# Patient Record
Sex: Female | Born: 2000 | Race: Black or African American | Hispanic: No | Marital: Single | Smoking: Never smoker
Health system: Southern US, Community
[De-identification: ages and names within clinical notes are randomized; demographics above are authoritative.]

## PROBLEM LIST (undated history)

## (undated) DIAGNOSIS — Z789 Other specified health status: Secondary | ICD-10-CM

## (undated) DIAGNOSIS — D649 Anemia, unspecified: Secondary | ICD-10-CM

## (undated) DIAGNOSIS — O139 Gestational [pregnancy-induced] hypertension without significant proteinuria, unspecified trimester: Secondary | ICD-10-CM

## (undated) HISTORY — PX: WISDOM TOOTH EXTRACTION: SHX21

## (undated) HISTORY — PX: NO PAST SURGERIES: SHX2092

## (undated) HISTORY — DX: Anemia, unspecified: D64.9

---
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
## 2021-03-13 ENCOUNTER — Inpatient Hospital Stay (HOSPITAL_COMMUNITY)
Admission: AD | Admit: 2021-03-13 | Discharge: 2021-03-13 | Disposition: A | Discharge status: Home / Self Care | Payer: Medicaid Other | Attending: Obstetrics and Gynecology | Admitting: Obstetrics and Gynecology

## 2021-03-13 ENCOUNTER — Other Ambulatory Visit: Payer: Self-pay

## 2021-03-13 ENCOUNTER — Encounter (HOSPITAL_COMMUNITY): Payer: Self-pay | Admitting: Obstetrics and Gynecology

## 2021-03-13 DIAGNOSIS — O99891 Other specified diseases and conditions complicating pregnancy: Secondary | ICD-10-CM | POA: Diagnosis not present

## 2021-03-13 DIAGNOSIS — O99350 Diseases of the nervous system complicating pregnancy, unspecified trimester: Secondary | ICD-10-CM | POA: Diagnosis not present

## 2021-03-13 DIAGNOSIS — G43009 Migraine without aura, not intractable, without status migrainosus: Secondary | ICD-10-CM

## 2021-03-13 DIAGNOSIS — G43909 Migraine, unspecified, not intractable, without status migrainosus: Secondary | ICD-10-CM | POA: Diagnosis not present

## 2021-03-13 DIAGNOSIS — R110 Nausea: Secondary | ICD-10-CM | POA: Insufficient documentation

## 2021-03-13 DIAGNOSIS — O219 Vomiting of pregnancy, unspecified: Secondary | ICD-10-CM

## 2021-03-13 DIAGNOSIS — Z3A00 Weeks of gestation of pregnancy not specified: Secondary | ICD-10-CM

## 2021-03-13 MED ORDER — PROMETHAZINE HCL 25 MG PO TABS: 25.0000 mg | ORAL_TABLET | Freq: Four times a day (QID) | ORAL | 0 refills | Status: DC | PRN

## 2021-03-13 MED ORDER — ACETAMINOPHEN 325 MG PO TABS: 650.0000 mg | ORAL_TABLET | ORAL | 0 refills | Status: DC | PRN

## 2021-03-13 MED ORDER — ACETAMINOPHEN 325 MG PO TABS: 650.0000 mg | ORAL_TABLET | ORAL | 0 refills | Status: AC | PRN

## 2021-03-13 NOTE — MAU Note (Addendum)
Having more nausea than usual (no vomiting) and getting headaches off and on. Hx migraines. Usually take tylenol for headaches but have not taken anything as I do not know what is safe in pregnancy. LMP 01/30/21 Denies VB or d/c and no pregnancy concerns

## 2021-03-13 NOTE — Discharge Instructions (Signed)

## 2021-03-13 NOTE — Progress Notes (Signed)
Thalia Bloodgood CNM in earlier to discuss d/c plan with pt. Written and verbal d/c instructions given and understanding voiced

## 2021-03-13 NOTE — MAU Provider Note (Signed)
Event Date/Time  First Provider Initiated Contact with Patient 03/13/21 0329     S Ms. Jessica Christian is a 20 y.o. G2P0 patient who presents to MAU today with chief complaints of nausea and migraine. These are recurrent problems which the patient typically manages with Tylenol. She reports a migraine pain score of 3/10 on arrival to MAU. She denies aggravating or alleviating factors. She has not taken medication or tried other treatments for this complaint. She declines medication in MAU.  O BP 122/72 (BP Location: Right Arm)   Pulse 92   Temp 98 F (36.7 C)   Resp 17   Ht 5\' 4"  (1.626 m)   Wt 109.8 kg   LMP 01/30/2021   SpO2 100%   BMI 41.54 kg/m    Physical Exam Vitals and nursing note reviewed.  Constitutional:      Appearance: She is well-developed.  Cardiovascular:     Rate and Rhythm: Normal rate and regular rhythm.     Heart sounds: Normal heart sounds.  Pulmonary:     Effort: Pulmonary effort is normal.     Breath sounds: Normal breath sounds.  Skin:    Capillary Refill: Capillary refill takes less than 2 seconds.  Neurological:     Mental Status: She is alert and oriented to person, place, and time.  Psychiatric:        Mood and Affect: Mood normal.        Speech: Speech normal.        Behavior: Behavior normal.    A Medical screening exam complete No acute concerns, declines treatment in MAU Nausea without vomiting Rx to pharmacy  Meds ordered this encounter  Medications   acetaminophen (TYLENOL) 325 MG tablet    Sig: Take 2 tablets (650 mg total) by mouth every 4 (four) hours as needed for moderate pain.    Dispense:  240 tablet    Refill:  0    Order Specific Question:   Supervising Provider    Answer:   02/01/2021 Myna Hidalgo   promethazine (PHENERGAN) 25 MG tablet    Sig: Take 1 tablet (25 mg total) by mouth every 6 (six) hours as needed for nausea or vomiting.    Dispense:  30 tablet    Refill:  0    Order Specific Question:   Supervising  Provider    Answer:   [[id number]] Myna Hidalgo    P Discharge from MAU in stable condition Patient may return to MAU as needed   [[id number]], CNM 03/13/2021 7:21 AM

## 2021-03-23 ENCOUNTER — Inpatient Hospital Stay (HOSPITAL_COMMUNITY)
Admission: AD | Admit: 2021-03-23 | Discharge: 2021-03-24 | Disposition: A | Discharge status: Home / Self Care | Payer: Medicaid Other | Attending: Obstetrics & Gynecology | Admitting: Obstetrics & Gynecology

## 2021-03-23 ENCOUNTER — Other Ambulatory Visit: Payer: Self-pay

## 2021-03-23 DIAGNOSIS — J069 Acute upper respiratory infection, unspecified: Secondary | ICD-10-CM

## 2021-03-23 DIAGNOSIS — O99511 Diseases of the respiratory system complicating pregnancy, first trimester: Secondary | ICD-10-CM | POA: Insufficient documentation

## 2021-03-23 DIAGNOSIS — Z20822 Contact with and (suspected) exposure to covid-19: Secondary | ICD-10-CM | POA: Insufficient documentation

## 2021-03-23 DIAGNOSIS — Z3A01 Less than 8 weeks gestation of pregnancy: Secondary | ICD-10-CM | POA: Insufficient documentation

## 2021-03-23 NOTE — MAU Note (Signed)
..  Jessica Christian is a 20 y.o. at [redacted]w[redacted]d here in MAU reporting: she has not been feeling well. Reports sore throat, nasal congestion, and sharp upper abdominal pain that comes and goes. Has not taken a COVID test. Reports that there isn't anyone around her that is sick.   Denies vaginal bleeding or leaking of fluid.   Pain score: 3/10 Vitals:   03/23/21 2358  BP: 127/84  Pulse: 92  Resp: 20  Temp: 98.9 F (37.2 C)  SpO2: 100%

## 2021-03-24 ENCOUNTER — Encounter (HOSPITAL_COMMUNITY): Payer: Self-pay | Admitting: Obstetrics & Gynecology

## 2021-03-24 DIAGNOSIS — Z3A01 Less than 8 weeks gestation of pregnancy: Secondary | ICD-10-CM

## 2021-03-24 DIAGNOSIS — J029 Acute pharyngitis, unspecified: Secondary | ICD-10-CM | POA: Diagnosis present

## 2021-03-24 DIAGNOSIS — J069 Acute upper respiratory infection, unspecified: Secondary | ICD-10-CM

## 2021-03-24 DIAGNOSIS — O99511 Diseases of the respiratory system complicating pregnancy, first trimester: Secondary | ICD-10-CM

## 2021-03-24 DIAGNOSIS — Z20822 Contact with and (suspected) exposure to covid-19: Secondary | ICD-10-CM | POA: Diagnosis not present

## 2021-03-24 LAB — RESP PANEL BY RT-PCR (FLU A&B, COVID) ARPGX2
Influenza A by PCR: NEGATIVE
Influenza B by PCR: NEGATIVE
SARS Coronavirus 2 by RT PCR: NEGATIVE

## 2021-03-24 LAB — GROUP A STREP BY PCR: Group A Strep by PCR: NOT DETECTED

## 2021-03-24 NOTE — MAU Provider Note (Signed)
History     CSN: [id number]  Arrival date and time: 03/23/21 2346   Event Date/Time   First Provider Initiated Contact with Patient 03/24/21 0008       20 y.o. G2P0010 @[redacted]w[redacted]d  presenting with sore throat and nasal congestion. Sx started 3 days ago. Denies cough or SOB. Denies fever. Denies sick contacts or Covid exposure. Has not tried OTC remedies yet.    OB History     Gravida  2   Para      Term      Preterm      AB  1   Living         SAB  1   IAB      Ectopic      Multiple      Live Births              Past Medical History:  Diagnosis Date   Medical history non-contributory     Past Surgical History:  Procedure Laterality Date   NO PAST SURGERIES      Family History  Problem Relation Age of Onset   Healthy Mother    Healthy Father     Social History   Tobacco Use   Smoking status: Never   Smokeless tobacco: Never  Vaping Use   Vaping Use: Never used  Substance Use Topics   Alcohol use: No   Drug use: Never    Allergies: No Known Allergies  No medications prior to admission.    Review of Systems  Constitutional:  Negative for chills and fever.  HENT:  Positive for congestion, rhinorrhea and sore throat. Negative for ear pain.   Respiratory:  Negative for cough and shortness of breath.   Physical Exam   Blood pressure 127/84, pulse 92, temperature 98.9 F (37.2 C), temperature source Oral, resp. rate 20, height 5\' 4"  (1.626 m), weight 110.4 kg, last menstrual period 01/30/2021, SpO2 100 %, unknown if currently breastfeeding.  Physical Exam Vitals and nursing note reviewed.  Constitutional:      General: She is not in acute distress.    Appearance: Normal appearance.  HENT:     Head: Normocephalic and atraumatic.     Mouth/Throat:     Mouth: Mucous membranes are moist.     Pharynx: Oropharynx is clear. Uvula midline. No oropharyngeal exudate or posterior oropharyngeal erythema.     Tonsils: No tonsillar exudate.   Cardiovascular:     Rate and Rhythm: Normal rate and regular rhythm.     Heart sounds: Normal heart sounds.  Pulmonary:     Effort: Pulmonary effort is normal. No respiratory distress.     Breath sounds: Normal breath sounds. No stridor. No wheezing, rhonchi or rales.  Musculoskeletal:        General: Normal range of motion.  Skin:    General: Skin is warm and dry.  Neurological:     General: No focal deficit present.     Mental Status: She is alert and oriented to person, place, and time.  Psychiatric:        Mood and Affect: Mood normal.        Behavior: Behavior normal.   Results for orders placed or performed during the hospital encounter of 03/23/21 (from the past 24 hour(s))  Group A Strep by PCR     Status: None   Collection Time: 03/24/21 12:34 AM   Specimen: Throat; Sterile Swab  Result Value Ref Range   Group A Strep by PCR  NOT DETECTED NOT DETECTED  Resp Panel by RT-PCR (Flu A&B, Covid) Nasopharyngeal Swab     Status: None   Collection Time: 03/24/21 12:34 AM   Specimen: Nasopharyngeal Swab; Nasopharyngeal(NP) swabs in vial transport medium  Result Value Ref Range   SARS Coronavirus 2 by RT PCR NEGATIVE NEGATIVE   Influenza A by PCR NEGATIVE NEGATIVE   Influenza B by PCR NEGATIVE NEGATIVE   MAU Course  Procedures  MDM Labs ordered. Likely viral, common cold. Recommended supportive therapy. Stable for discharge home.   Assessment and Plan   1. [redacted] weeks gestation of pregnancy   2. Upper respiratory virus    Discharge home Follow up with OB provider to start care Safe med list provided Return precautions  Allergies as of 03/24/2021   No Known Allergies      Medication List     TAKE these medications    acetaminophen 325 MG tablet Commonly known as: Tylenol Take 2 tablets (650 mg total) by mouth every 4 (four) hours as needed for moderate pain.   multivitamin-prenatal 27-0.8 MG Tabs tablet Take 1 tablet by mouth daily at 12 noon.   promethazine  25 MG tablet Commonly known as: PHENERGAN Take 1 tablet (25 mg total) by mouth every 6 (six) hours as needed for nausea or vomiting.       Donette Larry, CNM 03/24/2021, 2:13 AM

## 2021-03-24 NOTE — Discharge Instructions (Signed)

## 2021-04-22 LAB — OB RESULTS CONSOLE ABO/RH: RH Type: POSITIVE

## 2021-04-22 LAB — OB RESULTS CONSOLE RUBELLA ANTIBODY, IGM: Rubella: IMMUNE

## 2021-04-22 LAB — HEPATITIS C ANTIBODY: HCV Ab: NEGATIVE

## 2021-04-22 LAB — OB RESULTS CONSOLE HIV ANTIBODY (ROUTINE TESTING): HIV: NONREACTIVE

## 2021-04-22 LAB — OB RESULTS CONSOLE HEPATITIS B SURFACE ANTIGEN: Hepatitis B Surface Ag: NEGATIVE

## 2021-05-19 ENCOUNTER — Other Ambulatory Visit: Payer: Self-pay | Admitting: Obstetrics and Gynecology

## 2021-05-19 DIAGNOSIS — Z363 Encounter for antenatal screening for malformations: Secondary | ICD-10-CM

## 2021-05-27 ENCOUNTER — Other Ambulatory Visit: Payer: Self-pay

## 2021-06-07 NOTE — L&D Delivery Note (Signed)
Delivery Note She progressed to complete and pushed well.  At 7:10 PM a viable female was delivered via Vaginal, Spontaneous (Presentation: Right Occiput Anterior).  APGAR: 8, 9; weight pending.   Placenta status: Spontaneous, Intact.  Cord: 3 vessels with the following complications: None.  Anesthesia: Epidural Episiotomy: None Lacerations: 2nd degree;Perineal Suture Repair: 3.0 vicryl rapide Est. Blood Loss (mL): 100  Mom to postpartum.  Baby to Couplet care / Skin to Skin.  Leighton Roach Maxxwell Edgett 10/27/2021, 7:29 PM

## 2021-06-12 ENCOUNTER — Other Ambulatory Visit: Payer: Medicaid Other

## 2021-06-12 ENCOUNTER — Ambulatory Visit: Payer: Medicaid Other

## 2021-06-15 ENCOUNTER — Ambulatory Visit (HOSPITAL_BASED_OUTPATIENT_CLINIC_OR_DEPARTMENT_OTHER): Payer: Medicaid Other

## 2021-06-15 ENCOUNTER — Ambulatory Visit: Payer: Medicaid Other | Attending: Obstetrics and Gynecology | Admitting: *Deleted

## 2021-06-15 ENCOUNTER — Encounter: Payer: Self-pay | Admitting: *Deleted

## 2021-06-15 ENCOUNTER — Other Ambulatory Visit: Payer: Self-pay

## 2021-06-15 VITALS — BP 127/74 | HR 88

## 2021-06-15 DIAGNOSIS — O99212 Obesity complicating pregnancy, second trimester: Secondary | ICD-10-CM | POA: Diagnosis not present

## 2021-06-15 DIAGNOSIS — Z363 Encounter for antenatal screening for malformations: Secondary | ICD-10-CM | POA: Insufficient documentation

## 2021-06-15 DIAGNOSIS — Z3A19 19 weeks gestation of pregnancy: Secondary | ICD-10-CM | POA: Insufficient documentation

## 2021-06-15 DIAGNOSIS — O09892 Supervision of other high risk pregnancies, second trimester: Secondary | ICD-10-CM

## 2021-06-16 ENCOUNTER — Other Ambulatory Visit: Payer: Self-pay | Admitting: *Deleted

## 2021-06-16 DIAGNOSIS — O99212 Obesity complicating pregnancy, second trimester: Secondary | ICD-10-CM

## 2021-06-16 DIAGNOSIS — Z362 Encounter for other antenatal screening follow-up: Secondary | ICD-10-CM

## 2021-06-20 ENCOUNTER — Other Ambulatory Visit: Payer: Self-pay

## 2021-06-20 ENCOUNTER — Encounter (HOSPITAL_COMMUNITY): Payer: Self-pay | Admitting: Obstetrics and Gynecology

## 2021-06-20 ENCOUNTER — Inpatient Hospital Stay (HOSPITAL_COMMUNITY)
Admission: AD | Admit: 2021-06-20 | Discharge: 2021-06-20 | Disposition: A | Discharge status: Home / Self Care | Payer: Medicaid Other | Attending: Obstetrics and Gynecology | Admitting: Obstetrics and Gynecology

## 2021-06-20 DIAGNOSIS — Z3A20 20 weeks gestation of pregnancy: Secondary | ICD-10-CM | POA: Diagnosis not present

## 2021-06-20 DIAGNOSIS — O9A212 Injury, poisoning and certain other consequences of external causes complicating pregnancy, second trimester: Secondary | ICD-10-CM | POA: Insufficient documentation

## 2021-06-20 DIAGNOSIS — Y92410 Unspecified street and highway as the place of occurrence of the external cause: Secondary | ICD-10-CM | POA: Diagnosis not present

## 2021-06-20 DIAGNOSIS — O368120 Decreased fetal movements, second trimester, not applicable or unspecified: Secondary | ICD-10-CM | POA: Diagnosis not present

## 2021-06-20 DIAGNOSIS — T1490XA Injury, unspecified, initial encounter: Secondary | ICD-10-CM | POA: Insufficient documentation

## 2021-06-20 DIAGNOSIS — Z041 Encounter for examination and observation following transport accident: Secondary | ICD-10-CM | POA: Insufficient documentation

## 2021-06-20 LAB — URINALYSIS, ROUTINE W REFLEX MICROSCOPIC
Bilirubin Urine: NEGATIVE
Glucose, UA: NEGATIVE mg/dL
Hgb urine dipstick: NEGATIVE
Ketones, ur: 40 mg/dL — AB
Leukocytes,Ua: NEGATIVE
Nitrite: NEGATIVE
Protein, ur: NEGATIVE mg/dL
Specific Gravity, Urine: >1.03 — ABNORMAL HIGH (ref 1.005–1.030)
pH: 6 (ref 5.0–8.0)

## 2021-06-20 NOTE — Discharge Instructions (Signed)
You can take Tylenol 1000 mg every 8 hours as needed for pain.
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
## 2021-07-14 ENCOUNTER — Ambulatory Visit: Payer: Medicaid Other | Admitting: *Deleted

## 2021-07-14 ENCOUNTER — Encounter: Payer: Self-pay | Admitting: *Deleted

## 2021-07-14 ENCOUNTER — Ambulatory Visit: Payer: Medicaid Other | Attending: Obstetrics

## 2021-07-14 ENCOUNTER — Other Ambulatory Visit: Payer: Self-pay | Admitting: *Deleted

## 2021-07-14 ENCOUNTER — Other Ambulatory Visit: Payer: Self-pay

## 2021-07-14 VITALS — BP 130/70 | HR 90

## 2021-07-14 DIAGNOSIS — O283 Abnormal ultrasonic finding on antenatal screening of mother: Secondary | ICD-10-CM

## 2021-07-14 DIAGNOSIS — Z3A23 23 weeks gestation of pregnancy: Secondary | ICD-10-CM

## 2021-07-14 DIAGNOSIS — Z362 Encounter for other antenatal screening follow-up: Secondary | ICD-10-CM | POA: Diagnosis not present

## 2021-07-14 DIAGNOSIS — E668 Other obesity: Secondary | ICD-10-CM | POA: Diagnosis not present

## 2021-07-14 DIAGNOSIS — O99212 Obesity complicating pregnancy, second trimester: Secondary | ICD-10-CM | POA: Diagnosis not present

## 2021-07-14 DIAGNOSIS — Z6841 Body Mass Index (BMI) 40.0 and over, adult: Secondary | ICD-10-CM

## 2021-07-14 NOTE — Progress Notes (Unsigned)
Ud

## 2021-08-06 LAB — OB RESULTS CONSOLE RPR: RPR: NONREACTIVE

## 2021-08-11 ENCOUNTER — Ambulatory Visit: Payer: Medicaid Other | Admitting: *Deleted

## 2021-08-11 ENCOUNTER — Encounter: Payer: Self-pay | Admitting: *Deleted

## 2021-08-11 ENCOUNTER — Other Ambulatory Visit: Payer: Self-pay

## 2021-08-11 ENCOUNTER — Ambulatory Visit: Payer: Medicaid Other | Attending: Obstetrics

## 2021-08-11 VITALS — BP 127/69 | HR 93

## 2021-08-11 DIAGNOSIS — E669 Obesity, unspecified: Secondary | ICD-10-CM

## 2021-08-11 DIAGNOSIS — O283 Abnormal ultrasonic finding on antenatal screening of mother: Secondary | ICD-10-CM

## 2021-08-11 DIAGNOSIS — O99212 Obesity complicating pregnancy, second trimester: Secondary | ICD-10-CM | POA: Insufficient documentation

## 2021-08-11 DIAGNOSIS — O358XX0 Maternal care for other (suspected) fetal abnormality and damage, not applicable or unspecified: Secondary | ICD-10-CM | POA: Diagnosis not present

## 2021-08-11 DIAGNOSIS — Z3A27 27 weeks gestation of pregnancy: Secondary | ICD-10-CM | POA: Diagnosis not present

## 2021-08-12 ENCOUNTER — Other Ambulatory Visit: Payer: Self-pay | Admitting: *Deleted

## 2021-08-12 DIAGNOSIS — R638 Other symptoms and signs concerning food and fluid intake: Secondary | ICD-10-CM

## 2021-08-12 DIAGNOSIS — O283 Abnormal ultrasonic finding on antenatal screening of mother: Secondary | ICD-10-CM

## 2021-09-08 ENCOUNTER — Ambulatory Visit: Payer: Medicaid Other | Admitting: *Deleted

## 2021-09-08 ENCOUNTER — Ambulatory Visit: Payer: Medicaid Other | Attending: Obstetrics

## 2021-09-08 ENCOUNTER — Encounter: Payer: Self-pay | Admitting: *Deleted

## 2021-09-08 VITALS — BP 126/75 | HR 82

## 2021-09-08 DIAGNOSIS — E669 Obesity, unspecified: Secondary | ICD-10-CM

## 2021-09-08 DIAGNOSIS — Z3A31 31 weeks gestation of pregnancy: Secondary | ICD-10-CM | POA: Diagnosis not present

## 2021-09-08 DIAGNOSIS — O283 Abnormal ultrasonic finding on antenatal screening of mother: Secondary | ICD-10-CM | POA: Diagnosis present

## 2021-09-08 DIAGNOSIS — Z6841 Body Mass Index (BMI) 40.0 and over, adult: Secondary | ICD-10-CM

## 2021-09-08 DIAGNOSIS — O99213 Obesity complicating pregnancy, third trimester: Secondary | ICD-10-CM | POA: Diagnosis not present

## 2021-09-08 DIAGNOSIS — O358XX0 Maternal care for other (suspected) fetal abnormality and damage, not applicable or unspecified: Secondary | ICD-10-CM

## 2021-09-08 DIAGNOSIS — R638 Other symptoms and signs concerning food and fluid intake: Secondary | ICD-10-CM | POA: Insufficient documentation

## 2021-09-10 ENCOUNTER — Other Ambulatory Visit: Payer: Self-pay | Admitting: *Deleted

## 2021-09-10 DIAGNOSIS — O99213 Obesity complicating pregnancy, third trimester: Secondary | ICD-10-CM

## 2021-09-10 DIAGNOSIS — O283 Abnormal ultrasonic finding on antenatal screening of mother: Secondary | ICD-10-CM

## 2021-09-16 ENCOUNTER — Ambulatory Visit: Payer: Medicaid Other | Admitting: *Deleted

## 2021-09-16 ENCOUNTER — Encounter: Payer: Self-pay | Admitting: *Deleted

## 2021-09-16 ENCOUNTER — Ambulatory Visit: Payer: Medicaid Other | Attending: Obstetrics | Admitting: *Deleted

## 2021-09-16 VITALS — BP 122/81 | HR 79

## 2021-09-16 DIAGNOSIS — O99213 Obesity complicating pregnancy, third trimester: Secondary | ICD-10-CM | POA: Insufficient documentation

## 2021-09-16 DIAGNOSIS — O283 Abnormal ultrasonic finding on antenatal screening of mother: Secondary | ICD-10-CM

## 2021-09-16 DIAGNOSIS — E669 Obesity, unspecified: Secondary | ICD-10-CM | POA: Diagnosis not present

## 2021-09-16 DIAGNOSIS — Z3A31 31 weeks gestation of pregnancy: Secondary | ICD-10-CM | POA: Insufficient documentation

## 2021-09-16 NOTE — Procedures (Signed)
Jessica Christian ?06/03/2001 ?[redacted]w[redacted]d ? ?Fetus A Non-Stress Test Interpretation for 09/16/21 ? ?Indication:  obese ? ?Fetal Heart Rate A ?Mode: External ?Baseline Rate (A): 150 bpm ?Variability: Moderate ?Accelerations: 15 x 15 ?Decelerations: None ?Multiple birth?: No ? ?Uterine Activity ?Mode: Toco ?Contraction Frequency (min): none ?Resting Tone Palpated: Relaxed ? ?Interpretation (Fetal Testing) ?Nonstress Test Interpretation: Reactive ?Overall Impression: Reassuring for gestational age ?Comments: tracing reviewed by Dr. Grace Bushy ? ? ?

## 2021-09-20 ENCOUNTER — Inpatient Hospital Stay (HOSPITAL_COMMUNITY)
Admission: AD | Admit: 2021-09-20 | Discharge: 2021-09-21 | Disposition: A | Discharge status: Home / Self Care | Payer: Medicaid Other | Attending: Obstetrics and Gynecology | Admitting: Obstetrics and Gynecology

## 2021-09-20 ENCOUNTER — Other Ambulatory Visit: Payer: Self-pay

## 2021-09-20 DIAGNOSIS — O26899 Other specified pregnancy related conditions, unspecified trimester: Secondary | ICD-10-CM

## 2021-09-20 DIAGNOSIS — O26893 Other specified pregnancy related conditions, third trimester: Secondary | ICD-10-CM | POA: Insufficient documentation

## 2021-09-20 DIAGNOSIS — R102 Pelvic and perineal pain: Secondary | ICD-10-CM | POA: Insufficient documentation

## 2021-09-20 DIAGNOSIS — Z3A33 33 weeks gestation of pregnancy: Secondary | ICD-10-CM

## 2021-09-21 DIAGNOSIS — R102 Pelvic and perineal pain: Secondary | ICD-10-CM

## 2021-09-21 DIAGNOSIS — O26893 Other specified pregnancy related conditions, third trimester: Secondary | ICD-10-CM | POA: Diagnosis present

## 2021-09-21 DIAGNOSIS — Z3A33 33 weeks gestation of pregnancy: Secondary | ICD-10-CM

## 2021-09-21 DIAGNOSIS — O26899 Other specified pregnancy related conditions, unspecified trimester: Secondary | ICD-10-CM

## 2021-09-21 LAB — URINALYSIS, ROUTINE W REFLEX MICROSCOPIC
Bilirubin Urine: NEGATIVE
Glucose, UA: NEGATIVE mg/dL
Hgb urine dipstick: NEGATIVE
Ketones, ur: NEGATIVE mg/dL
Nitrite: NEGATIVE
Protein, ur: NEGATIVE mg/dL
Specific Gravity, Urine: 1.018 (ref 1.005–1.030)
pH: 6 (ref 5.0–8.0)

## 2021-09-21 MED ORDER — CYCLOBENZAPRINE HCL 5 MG PO TABS
10.0000 mg | ORAL_TABLET | Freq: Once | ORAL | Status: AC
Start: 2021-09-21 — End: 2021-09-21
  Administered 2021-09-21: 10 mg via ORAL
  Filled 2021-09-21: qty 2

## 2021-09-21 NOTE — MAU Provider Note (Signed)
?History  ?  ? ?CSN: [id number] ? ?Arrival date and time: 09/20/21 2236 ? ? Event Date/Time  ? First Provider Initiated Contact with Patient 09/21/21 0016   ?  ? ?Chief Complaint  ?Patient presents with  ? Back Pain  ? ?HPI ?Jessica Christian is a 21 y.o. G2P0010 at [redacted]w[redacted]d who presents with upper back pain and pelvic pain. She reports she suddenly started having pain in the middle of her upper back 2 days ago. She rates the pain a 6/10 and has not tried anything for it. She also reports intermittent pain in her pelvis that she rates a 5/10 and has not tried anything for. She reports that pain is worse when she moves or changes position. She denies any bleeding or leaking. She reports normal fetal movement.  ? ?OB History   ? ? Gravida  ?2  ? Para  ?   ? Term  ?   ? Preterm  ?   ? AB  ?1  ? Living  ?   ?  ? ? SAB  ?1  ? IAB  ?   ? Ectopic  ?   ? Multiple  ?   ? Live Births  ?   ?   ?  ?  ? ? ?Past Medical History:  ?Diagnosis Date  ? Medical history non-contributory   ? ? ?Past Surgical History:  ?Procedure Laterality Date  ? WISDOM TOOTH EXTRACTION    ? ? ?Family History  ?Problem Relation Age of Onset  ? Healthy Mother   ? Healthy Father   ? Hypertension Maternal Grandmother   ? ? ?Social History  ? ?Tobacco Use  ? Smoking status: Never  ? Smokeless tobacco: Never  ?Vaping Use  ? Vaping Use: Never used  ?Substance Use Topics  ? Alcohol use: No  ? Drug use: Never  ? ? ?Allergies: No Known Allergies ? ?Medications Prior to Admission  ?Medication Sig Dispense Refill Last Dose  ? aspirin EC 81 MG tablet Take 81 mg by mouth daily. Swallow whole.   09/20/2021  ? Prenatal Vit-Fe Fumarate-FA (MULTIVITAMIN-PRENATAL) 27-0.8 MG TABS tablet Take 1 tablet by mouth daily at 12 noon.   09/20/2021  ? ? ?Review of Systems  ?Constitutional: Negative.  Negative for fatigue and fever.  ?HENT: Negative.    ?Respiratory: Negative.  Negative for shortness of breath.   ?Cardiovascular: Negative.  Negative for chest pain.  ?Gastrointestinal:  Negative.  Negative for abdominal pain, constipation, diarrhea, nausea and vomiting.  ?Genitourinary:  Positive for pelvic pain. Negative for dysuria, vaginal bleeding and vaginal discharge.  ?Musculoskeletal:  Positive for back pain.  ?Neurological: Negative.  Negative for dizziness and headaches.  ?Physical Exam  ? ?Blood pressure 123/79, pulse 84, temperature 98.7 ?F (37.1 ?C), temperature source Oral, resp. rate 18, weight 114.3 kg, last menstrual period 01/30/2021, SpO2 99 %, unknown if currently breastfeeding. ? ?Physical Exam ?Vitals and nursing note reviewed.  ?Constitutional:   ?   General: She is not in acute distress. ?   Appearance: She is well-developed.  ?HENT:  ?   Head: Normocephalic.  ?Eyes:  ?   Pupils: Pupils are equal, round, and reactive to light.  ?Cardiovascular:  ?   Rate and Rhythm: Normal rate and regular rhythm.  ?   Heart sounds: Normal heart sounds.  ?Pulmonary:  ?   Effort: Pulmonary effort is normal. No respiratory distress.  ?   Breath sounds: Normal breath sounds.  ?Abdominal:  ?   General: Bowel  sounds are normal. There is no distension.  ?   Palpations: Abdomen is soft.  ?   Tenderness: There is no abdominal tenderness.  ?Skin: ?   General: Skin is warm and dry.  ?Neurological:  ?   Mental Status: She is alert and oriented to person, place, and time.  ?Psychiatric:     ?   Mood and Affect: Mood normal.     ?   Behavior: Behavior normal.     ?   Thought Content: Thought content normal.     ?   Judgment: Judgment normal.  ? ?Fetal Tracing: ? ?Baseline: 140 ?Variability: moderate ?Accels:  15x15 ?Decels: none ? ?Toco: none ? ?Dilation: Closed ?Effacement (%): Thick ?Exam by:: Maryruth Hancock CNM ? ? ?MAU Course  ?Procedures ?Results for orders placed or performed during the hospital encounter of 09/20/21 (from the past 24 hour(s))  ?Urinalysis, Routine w reflex microscopic     Status: Abnormal  ? Collection Time: 09/21/21 12:33 AM  ?Result Value Ref Range  ? Color, Urine YELLOW YELLOW  ?  APPearance CLEAR CLEAR  ? Specific Gravity, Urine 1.018 1.005 - 1.030  ? pH 6.0 5.0 - 8.0  ? Glucose, UA NEGATIVE NEGATIVE mg/dL  ? Hgb urine dipstick NEGATIVE NEGATIVE  ? Bilirubin Urine NEGATIVE NEGATIVE  ? Ketones, ur NEGATIVE NEGATIVE mg/dL  ? Protein, ur NEGATIVE NEGATIVE mg/dL  ? Nitrite NEGATIVE NEGATIVE  ? Leukocytes,Ua TRACE (A) NEGATIVE  ? WBC, UA 0-5 0 - 5 WBC/hpf  ? Bacteria, UA RARE (A) NONE SEEN  ? Squamous Epithelial / LPF 0-5 0 - 5  ? Mucus PRESENT   ?  ?MDM ?UA ?Cervix closed/thick/posterior, no uterine activity ?Flexeril PO ? ?Assessment and Plan  ? ?1. Pain of round ligament affecting pregnancy, antepartum   ?2. [redacted] weeks gestation of pregnancy   ? ?-Discharge home in stable condition ?-Preterm labor precautions discussed ?-Patient advised to follow-up with OB as scheduled for prenatal care ?-Patient may return to MAU as needed or if her condition were to change or worsen ? ? ?Wende Mott CNM ?09/21/2021, 12:16 AM  ?

## 2021-09-21 NOTE — Discharge Instructions (Signed)

## 2021-09-21 NOTE — MAU Note (Signed)
Pt reports to MAU with c/o upper and middle back pain and lower pelvic pain for the past 2 days. Nothing relieves the pain and she has not taken any medication for it. Denies VB. Reports 'jelly'-like discharge. +FM. Pain - 6/10.  ?

## 2021-09-22 ENCOUNTER — Ambulatory Visit: Payer: Medicaid Other | Admitting: *Deleted

## 2021-09-22 ENCOUNTER — Ambulatory Visit: Payer: Medicaid Other | Attending: Obstetrics

## 2021-09-22 VITALS — BP 124/72 | HR 86

## 2021-09-22 DIAGNOSIS — R638 Other symptoms and signs concerning food and fluid intake: Secondary | ICD-10-CM | POA: Insufficient documentation

## 2021-09-22 DIAGNOSIS — Z3A33 33 weeks gestation of pregnancy: Secondary | ICD-10-CM

## 2021-09-22 DIAGNOSIS — E669 Obesity, unspecified: Secondary | ICD-10-CM

## 2021-09-22 DIAGNOSIS — O283 Abnormal ultrasonic finding on antenatal screening of mother: Secondary | ICD-10-CM

## 2021-09-22 DIAGNOSIS — O358XX0 Maternal care for other (suspected) fetal abnormality and damage, not applicable or unspecified: Secondary | ICD-10-CM

## 2021-09-22 DIAGNOSIS — O99213 Obesity complicating pregnancy, third trimester: Secondary | ICD-10-CM

## 2021-09-25 ENCOUNTER — Encounter (HOSPITAL_COMMUNITY): Payer: Self-pay | Admitting: Obstetrics

## 2021-09-25 ENCOUNTER — Inpatient Hospital Stay (HOSPITAL_COMMUNITY)
Admission: AD | Admit: 2021-09-25 | Discharge: 2021-09-25 | Disposition: A | Discharge status: Home / Self Care | Payer: Medicaid Other | Attending: Obstetrics | Admitting: Obstetrics

## 2021-09-25 DIAGNOSIS — Z3689 Encounter for other specified antenatal screening: Secondary | ICD-10-CM

## 2021-09-25 DIAGNOSIS — O368130 Decreased fetal movements, third trimester, not applicable or unspecified: Secondary | ICD-10-CM | POA: Diagnosis not present

## 2021-09-25 DIAGNOSIS — O26893 Other specified pregnancy related conditions, third trimester: Secondary | ICD-10-CM | POA: Diagnosis present

## 2021-09-25 DIAGNOSIS — O98813 Other maternal infectious and parasitic diseases complicating pregnancy, third trimester: Secondary | ICD-10-CM | POA: Insufficient documentation

## 2021-09-25 DIAGNOSIS — Z3A34 34 weeks gestation of pregnancy: Secondary | ICD-10-CM

## 2021-09-25 DIAGNOSIS — B3731 Acute candidiasis of vulva and vagina: Secondary | ICD-10-CM | POA: Diagnosis not present

## 2021-09-25 DIAGNOSIS — O368190 Decreased fetal movements, unspecified trimester, not applicable or unspecified: Secondary | ICD-10-CM

## 2021-09-25 LAB — WET PREP, GENITAL
Sperm: NONE SEEN
Trich, Wet Prep: NONE SEEN
WBC, Wet Prep HPF POC: >=10 — AB (ref <10)

## 2021-09-25 MED ORDER — TERCONAZOLE 0.4 % VA CREA: 1.0000 | TOPICAL_CREAM | Freq: Every day | VAGINAL | 0 refills | Status: DC

## 2021-09-25 NOTE — MAU Provider Note (Signed)
?History  ?  ? ?CSN: [id number] ? ?Arrival date and time: 09/25/21 0749 ? ? Event Date/Time  ? First Provider Initiated Contact with Patient 09/25/21 [phone]   ?  ? ?Chief Complaint  ?Patient presents with  ? Decreased Fetal Movement  ? ?HPI ? ?[name] is a 21 y.o. female G2P0010 @ [redacted]w[redacted]d here in MAU with decreased fetal movement. Since arrival to the MAU she reports normal fetal movement.  She reports vaginal itching with white discharge. No bleeding or leaking of fluid.  ? ?OB History   ? ? Gravida  ?2  ? Para  ?   ? Term  ?   ? Preterm  ?   ? AB  ?1  ? Living  ?   ?  ? ? SAB  ?1  ? IAB  ?   ? Ectopic  ?   ? Multiple  ?   ? Live Births  ?   ?   ?  ?  ? ? ?Past Medical History:  ?Diagnosis Date  ? Medical history non-contributory   ? ? ?Past Surgical History:  ?Procedure Laterality Date  ? NO PAST SURGERIES    ? WISDOM TOOTH EXTRACTION    ? ? ?Family History  ?Problem Relation Age of Onset  ? Healthy Mother   ? Healthy Father   ? Hypertension Maternal Grandmother   ? ? ?Social History  ? ?Tobacco Use  ? Smoking status: Never  ? Smokeless tobacco: Never  ?Vaping Use  ? Vaping Use: Never used  ?Substance Use Topics  ? Alcohol use: No  ? Drug use: Never  ? ? ?Allergies: No Known Allergies ? ?Medications Prior to Admission  ?Medication Sig Dispense Refill Last Dose  ? aspirin EC 81 MG tablet Take 81 mg by mouth daily. Swallow whole.   09/24/2021  ? Prenatal Vit-Fe Fumarate-FA (MULTIVITAMIN-PRENATAL) 27-0.8 MG TABS tablet Take 1 tablet by mouth daily at 12 noon.   09/24/2021  ? ?Results for orders placed or performed during the hospital encounter of 09/25/21 (from the past 48 hour(s))  ?Wet prep, genital     Status: Abnormal  ? Collection Time: 09/25/21  8:41 AM  ? Specimen: Vaginal  ?Result Value Ref Range  ? Yeast Wet Prep HPF POC PRESENT (A) NONE SEEN  ? Trich, Wet Prep NONE SEEN NONE SEEN  ? Clue Cells Wet Prep HPF POC PRESENT (A) NONE SEEN  ? WBC, Wet Prep HPF POC >=10 (A) <10  ? Sperm NONE SEEN   ?  Comment:  Performed at [facility], [address], [place], [place] [address]  ?  ?Review of Systems  ?Gastrointestinal:  Negative for abdominal pain.  ?Genitourinary:  Positive for vaginal discharge.  ?Physical Exam  ? ?Blood pressure 115/84, pulse 93, temperature 98.7 ?F (37.1 ?C), temperature source Oral, height 5\' 3"  (1.6 m), weight 113.7 kg, last menstrual period 01/30/2021, SpO2 100 %, unknown if currently breastfeeding. ? ?Physical Exam ?Constitutional:   ?   General: She is not in acute distress. ?   Appearance: Normal appearance. She is obese. She is not ill-appearing, toxic-appearing or diaphoretic.  ?Skin: ?   General: Skin is warm.  ?Neurological:  ?   Mental Status: She is alert and oriented to person, place, and time.  ?Psychiatric:     ?   Behavior: Behavior normal.  ? ?Fetal Tracing: ?Baseline: 145 bpm  ?Variability: Moderate  ?Accelerations: 15x15 ?Decelerations: None  ?Toco:  ? ?MAU Course  ?Procedures ?None ? ?  MDM ? ?Wet prep and GC collected ?Reactive NST with patient feeling movement now.  ? ?Assessment and Plan  ? ?A: ? ?1. Vaginal yeast infection   ?2. Decreased fetal movement during pregnancy, antepartum, single or unspecified fetus   ?3. NST (non-stress test) reactive   ?4. [redacted] weeks gestation of pregnancy   ?  ? ?P: ? ?DC home ?Rx: Terazol ?Return to MAU if symptoms worsen  ?Fetal kicks counts ? ? ?Duane Lope, NP ?09/25/2021 ?2:27 PM ? ?

## 2021-09-25 NOTE — MAU Note (Signed)
Chanci Muller is a 21 y.o. at [redacted]w[redacted]d here in MAU reporting: one movement yesterday while at working, nothing since.  Usually moving all the time. Denies pain, bleeding or leaking. ?Pt directly to rm ?Onset of complaint: yesterday ?Pain score:   ?There were no vitals filed for this visit.   ? ?Lab orders placed from triage:  none ?

## 2021-09-25 NOTE — MAU Note (Signed)
Initial FHR 164 with external monitors. Fetal movement clicker given. ?

## 2021-09-30 ENCOUNTER — Ambulatory Visit: Payer: Medicaid Other | Attending: Obstetrics

## 2021-09-30 ENCOUNTER — Ambulatory Visit: Payer: Medicaid Other | Admitting: *Deleted

## 2021-09-30 VITALS — BP 125/83 | HR 82

## 2021-09-30 DIAGNOSIS — Z3A34 34 weeks gestation of pregnancy: Secondary | ICD-10-CM

## 2021-09-30 DIAGNOSIS — O283 Abnormal ultrasonic finding on antenatal screening of mother: Secondary | ICD-10-CM | POA: Insufficient documentation

## 2021-09-30 DIAGNOSIS — O99213 Obesity complicating pregnancy, third trimester: Secondary | ICD-10-CM | POA: Diagnosis not present

## 2021-09-30 DIAGNOSIS — O358XX0 Maternal care for other (suspected) fetal abnormality and damage, not applicable or unspecified: Secondary | ICD-10-CM

## 2021-09-30 DIAGNOSIS — E669 Obesity, unspecified: Secondary | ICD-10-CM

## 2021-10-06 ENCOUNTER — Encounter: Payer: Self-pay | Admitting: *Deleted

## 2021-10-06 ENCOUNTER — Other Ambulatory Visit: Payer: Self-pay | Admitting: *Deleted

## 2021-10-06 ENCOUNTER — Ambulatory Visit: Payer: Medicaid Other | Attending: Obstetrics

## 2021-10-06 ENCOUNTER — Ambulatory Visit: Payer: Medicaid Other | Admitting: *Deleted

## 2021-10-06 VITALS — BP 135/84 | HR 77

## 2021-10-06 DIAGNOSIS — Z6841 Body Mass Index (BMI) 40.0 and over, adult: Secondary | ICD-10-CM

## 2021-10-06 DIAGNOSIS — Z3A35 35 weeks gestation of pregnancy: Secondary | ICD-10-CM | POA: Diagnosis not present

## 2021-10-06 DIAGNOSIS — O358XX0 Maternal care for other (suspected) fetal abnormality and damage, not applicable or unspecified: Secondary | ICD-10-CM

## 2021-10-06 DIAGNOSIS — O99213 Obesity complicating pregnancy, third trimester: Secondary | ICD-10-CM

## 2021-10-06 DIAGNOSIS — E669 Obesity, unspecified: Secondary | ICD-10-CM

## 2021-10-06 DIAGNOSIS — O283 Abnormal ultrasonic finding on antenatal screening of mother: Secondary | ICD-10-CM | POA: Insufficient documentation

## 2021-10-12 ENCOUNTER — Encounter (HOSPITAL_COMMUNITY): Payer: Self-pay | Admitting: Obstetrics and Gynecology

## 2021-10-12 ENCOUNTER — Other Ambulatory Visit: Payer: Self-pay

## 2021-10-12 ENCOUNTER — Inpatient Hospital Stay (HOSPITAL_COMMUNITY)
Admission: AD | Admit: 2021-10-12 | Discharge: 2021-10-12 | Disposition: A | Discharge status: Home / Self Care | Payer: Medicaid Other | Attending: Obstetrics and Gynecology | Admitting: Obstetrics and Gynecology

## 2021-10-12 DIAGNOSIS — Z3689 Encounter for other specified antenatal screening: Secondary | ICD-10-CM

## 2021-10-12 DIAGNOSIS — R109 Unspecified abdominal pain: Secondary | ICD-10-CM | POA: Diagnosis not present

## 2021-10-12 DIAGNOSIS — Z3A36 36 weeks gestation of pregnancy: Secondary | ICD-10-CM | POA: Insufficient documentation

## 2021-10-12 DIAGNOSIS — O26893 Other specified pregnancy related conditions, third trimester: Secondary | ICD-10-CM | POA: Diagnosis present

## 2021-10-12 DIAGNOSIS — O479 False labor, unspecified: Secondary | ICD-10-CM

## 2021-10-12 DIAGNOSIS — O4703 False labor before 37 completed weeks of gestation, third trimester: Secondary | ICD-10-CM | POA: Diagnosis not present

## 2021-10-12 LAB — URINALYSIS, ROUTINE W REFLEX MICROSCOPIC
Bilirubin Urine: NEGATIVE
Glucose, UA: NEGATIVE mg/dL
Hgb urine dipstick: NEGATIVE
Ketones, ur: NEGATIVE mg/dL
Leukocytes,Ua: NEGATIVE
Nitrite: NEGATIVE
Protein, ur: NEGATIVE mg/dL
Specific Gravity, Urine: 1.023 (ref 1.005–1.030)
pH: 5 (ref 5.0–8.0)

## 2021-10-12 NOTE — MAU Provider Note (Signed)
?History  ?  ? ?CSN: [id number] ? ?Arrival date and time: 10/12/21 1324 ? ? Event Date/Time  ? First Provider Initiated Contact with Patient 10/12/21 1416   ?  ? ?Chief Complaint  ?Patient presents with  ? Abdominal Pain  ? ?21 year old G2 P0-0-1-0 at 36.3 weeks presenting with abdominal pain.  Patient reports episode of sharp abdominal pain all over her abdomen that happened while she was working.  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  Pain lasted a few minutes then she noticed abdominal tightening about every 5 minutes.  Pain has resolved since then.  Denies VB or LOF.  Denies urinary symptoms.  She admits to poor hydration today with a small amount of water and juice intake. ? ? ?OB History   ? ? Gravida  ?2  ? Para  ?   ? Term  ?   ? Preterm  ?   ? AB  ?1  ? Living  ?   ?  ? ? SAB  ?1  ? IAB  ?   ? Ectopic  ?   ? Multiple  ?   ? Live Births  ?   ?   ?  ?  ? ? ?Past Medical History:  ?Diagnosis Date  ? Medical history non-contributory   ? ? ?Past Surgical History:  ?Procedure Laterality Date  ? NO PAST SURGERIES    ? WISDOM TOOTH EXTRACTION    ? ? ?Family History  ?Problem Relation Age of Onset  ? Healthy Mother   ? Healthy Father   ? Hypertension Maternal Grandmother   ? ? ?Social History  ? ?Tobacco Use  ? Smoking status: Never  ? Smokeless tobacco: Never  ?Vaping Use  ? Vaping Use: Never used  ?Substance Use Topics  ? Alcohol use: No  ? Drug use: Never  ? ? ?Allergies: No Known Allergies ? ?No medications prior to admission.  ? ? ?Review of Systems  ?Constitutional:  Negative for chills and fever.  ?Gastrointestinal:  Positive for abdominal pain. Negative for nausea.  ?Genitourinary:  Negative for dysuria, hematuria, vaginal bleeding and vaginal discharge.  ?Physical Exam  ? ?Blood pressure 128/87, pulse 99, temperature 98.5 ?F (36.9 ?C), temperature source Oral, resp. rate 19, height 5\' 3"  (1.6 m), weight 112.7 kg, last menstrual period 01/30/2021, SpO2 99 %, unknown if currently  breastfeeding. ? ?Physical Exam ?Vitals and nursing note reviewed.  ?Constitutional:   ?   General: She is not in acute distress. ?   Appearance: Normal appearance.  ?HENT:  ?   Head: Normocephalic and atraumatic.  ?Cardiovascular:  ?   Rate and Rhythm: Normal rate.  ?Pulmonary:  ?   Effort: Pulmonary effort is normal. No respiratory distress.  ?Abdominal:  ?   Palpations: Abdomen is soft.  ?   Tenderness: There is no abdominal tenderness.  ?   Comments: Gravid  ?Genitourinary: ?   Comments: VE: closed/thick ?Musculoskeletal:     ?   General: Normal range of motion.  ?   Cervical back: Normal range of motion.  ?Skin: ?   General: Skin is warm and dry.  ?Neurological:  ?   General: No focal deficit present.  ?   Mental Status: She is alert and oriented to person, place, and time.  ?Psychiatric:     ?   Mood and Affect: Mood normal.     ?   Behavior: Behavior normal.  ?EFM: 145 bpm, mod variability, + accels, no decels ?Toco: rare ? ?  Results for orders placed or performed during the hospital encounter of 10/12/21 (from the past 24 hour(s))  ?Urinalysis, Routine w reflex microscopic Urine, Clean Catch     Status: Abnormal  ? Collection Time: 10/12/21  2:05 PM  ?Result Value Ref Range  ? Color, Urine YELLOW YELLOW  ? APPearance HAZY (A) CLEAR  ? Specific Gravity, Urine 1.023 1.005 - 1.030  ? pH 5.0 5.0 - 8.0  ? Glucose, UA NEGATIVE NEGATIVE mg/dL  ? Hgb urine dipstick NEGATIVE NEGATIVE  ? Bilirubin Urine NEGATIVE NEGATIVE  ? Ketones, ur NEGATIVE NEGATIVE mg/dL  ? Protein, ur NEGATIVE NEGATIVE mg/dL  ? Nitrite NEGATIVE NEGATIVE  ? Leukocytes,Ua NEGATIVE NEGATIVE  ? ?MAU Course  ?Procedures ? ?MDM ?Prenatal records reviewed. Pregnancy complicated by obesity and EIF. Labs ordered and reviewed. No evidence of acute process or PTL. Discussed increasing water intake and reducing physical activity at work, note provided. Stable for discharge. ? ?Assessment and Plan  ? ?1. [redacted] weeks gestation of pregnancy   ?2. NST (non-stress  test) reactive   ?3. Braxton Hicks contractions   ? ?Discharge home ?Follow up at [facility] as scheduled ?PTL precautions ? ?Allergies as of 10/12/2021   ?No Known Allergies ?  ? ?  ?Medication List  ?  ? ?STOP taking these medications   ? ?terconazole 0.4 % vaginal cream ?Commonly known as: TERAZOL 7 ?  ? ?  ? ?TAKE these medications   ? ?aspirin EC 81 MG tablet ?Take 81 mg by mouth daily. Swallow whole. ?  ?multivitamin-prenatal 27-0.8 MG Tabs tablet ?Take 1 tablet by mouth daily at 12 noon. ?  ? ?  ? ?Donette Larry, CNM ?10/12/2021, 4:50 PM  ?

## 2021-10-12 NOTE — MAU Note (Signed)
Jessica Christian is a 21 y.o. at [redacted]w[redacted]d here in MAU reporting: while she was at work she started having sharp abdominal pains. Pain is on the left side of her abdomen. Was having some tightening but none currently. No bleeding or LOF. +FM ? ?Onset of complaint: today ? ?Pain score: 5/10 ? ?Vitals:  ? 10/12/21 1348  ?BP: 129/87  ?Pulse: (!) 118  ?Resp: 16  ?Temp: 98.1 ?F (36.7 ?C)  ?SpO2: 99%  ?   ?FHT:152 ? ?Lab orders placed from triage: UA ? ?

## 2021-10-13 ENCOUNTER — Ambulatory Visit: Payer: Medicaid Other | Attending: Obstetrics

## 2021-10-13 ENCOUNTER — Ambulatory Visit: Payer: Medicaid Other | Admitting: *Deleted

## 2021-10-13 ENCOUNTER — Encounter: Payer: Self-pay | Admitting: *Deleted

## 2021-10-13 VITALS — BP 126/78 | HR 83

## 2021-10-13 DIAGNOSIS — O358XX0 Maternal care for other (suspected) fetal abnormality and damage, not applicable or unspecified: Secondary | ICD-10-CM

## 2021-10-13 DIAGNOSIS — O99213 Obesity complicating pregnancy, third trimester: Secondary | ICD-10-CM | POA: Diagnosis present

## 2021-10-13 DIAGNOSIS — E669 Obesity, unspecified: Secondary | ICD-10-CM

## 2021-10-13 DIAGNOSIS — Z3A36 36 weeks gestation of pregnancy: Secondary | ICD-10-CM | POA: Diagnosis not present

## 2021-10-13 DIAGNOSIS — O283 Abnormal ultrasonic finding on antenatal screening of mother: Secondary | ICD-10-CM

## 2021-10-14 ENCOUNTER — Ambulatory Visit: Payer: No Typology Code available for payment source

## 2021-10-14 ENCOUNTER — Ambulatory Visit: Payer: Medicaid Other

## 2021-10-14 LAB — OB RESULTS CONSOLE GC/CHLAMYDIA: Chlamydia: NEGATIVE

## 2021-10-14 LAB — OB RESULTS CONSOLE GBS: GBS: NEGATIVE

## 2021-10-20 ENCOUNTER — Encounter: Payer: Self-pay | Admitting: *Deleted

## 2021-10-20 ENCOUNTER — Ambulatory Visit: Payer: Medicaid Other | Admitting: *Deleted

## 2021-10-20 ENCOUNTER — Ambulatory Visit: Payer: Medicaid Other | Attending: Obstetrics

## 2021-10-20 VITALS — BP 134/86 | HR 72

## 2021-10-20 DIAGNOSIS — O283 Abnormal ultrasonic finding on antenatal screening of mother: Secondary | ICD-10-CM | POA: Insufficient documentation

## 2021-10-20 DIAGNOSIS — E669 Obesity, unspecified: Secondary | ICD-10-CM | POA: Diagnosis not present

## 2021-10-20 DIAGNOSIS — O99213 Obesity complicating pregnancy, third trimester: Secondary | ICD-10-CM

## 2021-10-20 DIAGNOSIS — Z6841 Body Mass Index (BMI) 40.0 and over, adult: Secondary | ICD-10-CM

## 2021-10-20 DIAGNOSIS — Z3A37 37 weeks gestation of pregnancy: Secondary | ICD-10-CM | POA: Diagnosis not present

## 2021-10-20 DIAGNOSIS — O358XX0 Maternal care for other (suspected) fetal abnormality and damage, not applicable or unspecified: Secondary | ICD-10-CM | POA: Diagnosis not present

## 2021-10-22 ENCOUNTER — Inpatient Hospital Stay (HOSPITAL_COMMUNITY)
Admission: AD | Admit: 2021-10-22 | Discharge: 2021-10-22 | Disposition: A | Discharge status: Home / Self Care | Payer: Medicaid Other | Attending: Obstetrics and Gynecology | Admitting: Obstetrics and Gynecology

## 2021-10-22 ENCOUNTER — Other Ambulatory Visit: Payer: Self-pay

## 2021-10-22 ENCOUNTER — Encounter (HOSPITAL_COMMUNITY): Payer: Self-pay | Admitting: Obstetrics and Gynecology

## 2021-10-22 DIAGNOSIS — O163 Unspecified maternal hypertension, third trimester: Secondary | ICD-10-CM

## 2021-10-22 DIAGNOSIS — R030 Elevated blood-pressure reading, without diagnosis of hypertension: Secondary | ICD-10-CM | POA: Insufficient documentation

## 2021-10-22 DIAGNOSIS — O10913 Unspecified pre-existing hypertension complicating pregnancy, third trimester: Secondary | ICD-10-CM | POA: Insufficient documentation

## 2021-10-22 DIAGNOSIS — Z3A37 37 weeks gestation of pregnancy: Secondary | ICD-10-CM | POA: Insufficient documentation

## 2021-10-22 DIAGNOSIS — O99891 Other specified diseases and conditions complicating pregnancy: Secondary | ICD-10-CM | POA: Diagnosis not present

## 2021-10-22 DIAGNOSIS — O26893 Other specified pregnancy related conditions, third trimester: Secondary | ICD-10-CM | POA: Diagnosis present

## 2021-10-22 DIAGNOSIS — Z3689 Encounter for other specified antenatal screening: Secondary | ICD-10-CM

## 2021-10-22 LAB — COMPREHENSIVE METABOLIC PANEL
ALT: 14 U/L (ref 0–44)
AST: 13 U/L — ABNORMAL LOW (ref 15–41)
Albumin: 2.3 g/dL — ABNORMAL LOW (ref 3.5–5.0)
Alkaline Phosphatase: 88 U/L (ref 38–126)
Anion gap: 6 (ref 5–15)
BUN: 11 mg/dL (ref 6–20)
CO2: 22 mmol/L (ref 22–32)
Calcium: 8.5 mg/dL — ABNORMAL LOW (ref 8.9–10.3)
Chloride: 107 mmol/L (ref 98–111)
Creatinine, Ser: 0.78 mg/dL (ref 0.44–1.00)
GFR, Estimated: >60 mL/min (ref >60)
Glucose, Bld: 88 mg/dL (ref 70–99)
Potassium: 3.8 mmol/L (ref 3.5–5.1)
Sodium: 135 mmol/L (ref 135–145)
Total Bilirubin: 0.5 mg/dL (ref 0.3–1.2)
Total Protein: 6.2 g/dL — ABNORMAL LOW (ref 6.5–8.1)

## 2021-10-22 LAB — URINALYSIS, ROUTINE W REFLEX MICROSCOPIC
Bilirubin Urine: NEGATIVE
Glucose, UA: NEGATIVE mg/dL
Hgb urine dipstick: NEGATIVE
Ketones, ur: NEGATIVE mg/dL
Leukocytes,Ua: NEGATIVE
Nitrite: NEGATIVE
Protein, ur: NEGATIVE mg/dL
Specific Gravity, Urine: 1.014 (ref 1.005–1.030)
pH: 6 (ref 5.0–8.0)

## 2021-10-22 LAB — PROTEIN / CREATININE RATIO, URINE
Creatinine, Urine: 87.75 mg/dL
Protein Creatinine Ratio: 0.11 mg/mg{Cre} (ref 0.00–0.15)
Total Protein, Urine: 10 mg/dL

## 2021-10-22 LAB — CBC
HCT: 36.9 % (ref 36.0–46.0)
Hemoglobin: 12 g/dL (ref 12.0–15.0)
MCH: 28.6 pg (ref 26.0–34.0)
MCHC: 32.5 g/dL (ref 30.0–36.0)
MCV: 87.9 fL (ref 80.0–100.0)
Platelets: 249 10*3/uL (ref 150–400)
RBC: 4.2 MIL/uL (ref 3.87–5.11)
RDW: 13.7 % (ref 11.5–15.5)
WBC: 7.6 10*3/uL (ref 4.0–10.5)
nRBC: 0 % (ref 0.0–0.2)

## 2021-10-22 MED ORDER — CYCLOBENZAPRINE HCL 10 MG PO TABS: 10.0000 mg | ORAL_TABLET | Freq: Two times a day (BID) | ORAL | 0 refills | Status: DC | PRN

## 2021-10-22 MED ORDER — CYCLOBENZAPRINE HCL 5 MG PO TABS
10.0000 mg | ORAL_TABLET | Freq: Once | ORAL | Status: AC
  Administered 2021-10-22: 10 mg via ORAL
  Filled 2021-10-22: qty 2

## 2021-10-22 NOTE — MAU Note (Signed)
..  Jessica Christian is a 21 y.o. at [redacted]w[redacted]d here in MAU reporting: constant back and lower shar, "pulling" ABD pain starting 1800 yesterday and throughout the night. Pt reports no extra activity yesterday. Pt reports having on episode of sudden tighteness this am when she woke up but not recurrent. Pt denies DFM, VB, LOF, abnormal discharge, PIH s/s, and complication in the pregnancy. Last intercourse was yesterday morning.  GBS neg SVE yesterday 0/closed Onset of complaint: 1800 yesterday  Pain score: 9/10 Vitals:   10/22/21 0637  BP: (!) 135/99  Resp: 20  Temp: 98.7 F (37.1 C)  SpO2: 100%     FHT:155 Lab orders placed from triage:

## 2021-10-22 NOTE — MAU Provider Note (Addendum)
Chief Complaint:  Back Pain and Abdominal Pain   Event Date/Time   First Provider Initiated Contact with Patient 10/22/21 0709     HPI: Jessica Christian is a 21 y.o. G2P0010 at 47w6dwho presents to maternity admissions reporting constant back pain, intermittent abdominal pain. Noted to have hypertension today, which is new. . She reports good fetal movement, denies LOF, vaginal bleeding, vaginal itching/burning, urinary symptoms, h/a, dizziness, n/v, diarrhea, constipation or fever/chills.  She denies headache, visual changes or RUQ abdominal pain.  Back Pain This is a new problem. The current episode started today. The problem is unchanged. The pain is present in the lumbar spine. The quality of the pain is described as aching and cramping. The pain does not radiate. Stiffness is present All day. Associated symptoms include abdominal pain. Pertinent negatives include no dysuria, fever, paresthesias or weakness. She has tried nothing for the symptoms.  Abdominal Pain The current episode started today. The problem occurs intermittently. The quality of the pain is described as cramping. Pertinent negatives include no dysuria or fever. Nothing relieves the symptoms. Past treatments include nothing.    RN Note: Jessica Christian is a 21 y.o. at [redacted]w[redacted]d here in MAU reporting: constant back and lower shar, "pulling" ABD pain starting 1800 yesterday and throughout the night. Pt reports no extra activity yesterday. Pt reports having on episode of sudden tighteness this am when she woke up but not recurrent. Pt denies DFM, VB, LOF, abnormal discharge, PIH s/s, and complication in the pregnancy. Last intercourse was yesterday morning.  GBS neg SVE yesterday 0/closed   Past Medical History: Past Medical History:  Diagnosis Date   Medical history non-contributory     Past obstetric history: OB History  Gravida Para Term Preterm AB Living  2       1    SAB IAB Ectopic Multiple Live Births  1             # Outcome Date GA Lbr Len/2nd Weight Sex Delivery Anes PTL Lv  2 Current           1 SAB 10/2020 [redacted]w[redacted]d           Past Surgical History: Past Surgical History:  Procedure Laterality Date   NO PAST SURGERIES     WISDOM TOOTH EXTRACTION      Family History: Family History  Problem Relation Age of Onset   Healthy Mother    Healthy Father    Hypertension Maternal Grandmother     Social History: Social History   Tobacco Use   Smoking status: Never   Smokeless tobacco: Never  Vaping Use   Vaping Use: Never used  Substance Use Topics   Alcohol use: No   Drug use: Never    Allergies: No Known Allergies  Meds:  No medications prior to admission.    I have reviewed patient's Past Medical Hx, Surgical Hx, Family Hx, Social Hx, medications and allergies.   ROS:  Review of Systems  Constitutional:  Negative for fever.  Gastrointestinal:  Positive for abdominal pain.  Genitourinary:  Negative for dysuria.  Musculoskeletal:  Positive for back pain.  Neurological:  Negative for weakness and paresthesias.  Other systems negative  Physical Exam  Patient Vitals for the past 24 hrs:  BP Temp Temp src Pulse Resp SpO2 Height Weight  10/22/21 0916 119/66 -- -- 72 -- -- -- --  10/22/21 0901 127/70 -- -- 75 -- -- -- --  10/22/21 0846 128/78 -- -- 78 -- -- -- --  10/22/21 0801 (!) 140/97 -- -- 96 -- -- -- --  10/22/21 0746 (!) 142/81 -- -- 84 -- -- -- --  10/22/21 0734 131/84 -- -- 100 -- -- -- --  10/22/21 0718 118/70 -- -- (!) 110 -- -- -- --  10/22/21 0700 133/89 -- -- 99 -- 100 % -- --  10/22/21 0637 (!) 135/99 98.7 F (37.1 C) Oral -- 20 100 % 5\' 3"  (1.6 m) 115.3 kg   Constitutional: Well-developed, well-nourished female in no acute distress.  Cardiovascular: normal rate and rhythm Respiratory: normal effort, clear to auscultation bilaterally GI: Abd soft, non-tender, gravid appropriate for gestational age.   No rebound or guarding. MS: Extremities nontender, no edema,  normal ROM   Tightness over lumbar spine.  Worse with bending. Neurologic: Alert and oriented x 4. DTRs 2+ GU: Neg CVAT.  PELVIC EXAM: Dilation: Fingertip Effacement (%): Thick Exam by:: n druebbisch rnFT/thick  FHT:  Baseline 140 , moderate variability, accelerations present, no decelerations Contractions: Irregular     Labs: Results for orders placed or performed during the hospital encounter of 10/22/21 (from the past 24 hour(s))  Urinalysis, Routine w reflex microscopic Urine, Clean Catch     Status: None   Collection Time: 10/22/21  7:04 AM  Result Value Ref Range   Color, Urine YELLOW YELLOW   APPearance CLEAR CLEAR   Specific Gravity, Urine 1.014 1.005 - 1.030   pH 6.0 5.0 - 8.0   Glucose, UA NEGATIVE NEGATIVE mg/dL   Hgb urine dipstick NEGATIVE NEGATIVE   Bilirubin Urine NEGATIVE NEGATIVE   Ketones, ur NEGATIVE NEGATIVE mg/dL   Protein, ur NEGATIVE NEGATIVE mg/dL   Nitrite NEGATIVE NEGATIVE   Leukocytes,Ua NEGATIVE NEGATIVE  Protein / creatinine ratio, urine     Status: None   Collection Time: 10/22/21  7:04 AM  Result Value Ref Range   Creatinine, Urine 87.75 mg/dL   Total Protein, Urine 10 mg/dL   Protein Creatinine Ratio 0.11 0.00 - 0.15 mg/mg[Cre]  CBC     Status: None   Collection Time: 10/22/21  7:23 AM  Result Value Ref Range   WBC 7.6 4.0 - 10.5 K/uL   RBC 4.20 3.87 - 5.11 MIL/uL   Hemoglobin 12.0 12.0 - 15.0 g/dL   HCT 16.136.9 09.636.0 - 04.546.0 %   MCV 87.9 80.0 - 100.0 fL   MCH 28.6 26.0 - 34.0 pg   MCHC 32.5 30.0 - 36.0 g/dL   RDW 40.913.7 81.111.5 - 91.415.5 %   Platelets 249 150 - 400 K/uL   nRBC 0.0 0.0 - 0.2 %  Comprehensive metabolic panel     Status: Abnormal   Collection Time: 10/22/21  7:23 AM  Result Value Ref Range   Sodium 135 135 - 145 mmol/L   Potassium 3.8 3.5 - 5.1 mmol/L   Chloride 107 98 - 111 mmol/L   CO2 22 22 - 32 mmol/L   Glucose, Bld 88 70 - 99 mg/dL   BUN 11 6 - 20 mg/dL   Creatinine, Ser 7.820.78 0.44 - 1.00 mg/dL   Calcium 8.5 (L) 8.9 - 10.3  mg/dL   Total Protein 6.2 (L) 6.5 - 8.1 g/dL   Albumin 2.3 (L) 3.5 - 5.0 g/dL   AST 13 (L) 15 - 41 U/L   ALT 14 0 - 44 U/L   Alkaline Phosphatase 88 38 - 126 U/L   Total Bilirubin 0.5 0.3 - 1.2 mg/dL   GFR, Estimated >95>60 >62>60 mL/min   Anion gap 6 5 -  15    MAU Course/MDM: I have reviewed the triage vital signs and the nursing notes.   Pertinent labs & imaging results that were available during my care of the patient were reviewed by me and considered in my medical decision making (see chart for details).      I have reviewed her medical records including past results, notes and treatments.   I have ordered labs to evaluate for preeclampsia NST reviewed, reactive   Treatments in MAU included Flexeril for back pain.   Care turned over to [facility]  Wynelle Bourgeois CNM, MSN Certified Nurse-Midwife 10/22/2021 7:09 AM  Reviewed prenatal records: pregnancy complicated by teen pregnancy.  Labs reviewed. Pt reports improved back pain. No signs of labor. Denies PEC sx. Doesn't meet criteria today for gHTN. Discussed presentation and clinical findings with Dr. Senaida Ores who will arrange BP check in office tomorrow. She is stable for discharge.   Assessment: 1. [redacted] weeks gestation of pregnancy   2. NST (non-stress test) reactive   3. Back pain affecting pregnancy in third trimester   4. Elevated blood pressure affecting pregnancy in third trimester, antepartum    Plan: Discharge home Follow up at [facility] tomorrow for BP check PEC precautions Labor precautions  Allergies as of 10/22/2021   No Known Allergies      Medication List     TAKE these medications    aspirin EC 81 MG tablet Take 81 mg by mouth daily. Swallow whole.   cyclobenzaprine 10 MG tablet Commonly known as: FLEXERIL Take 1 tablet (10 mg total) by mouth 2 (two) times daily as needed for muscle spasms.   multivitamin-prenatal 27-0.8 MG Tabs tablet Take 1 tablet by mouth daily at 12 noon.        Donette Larry, CNM  10/22/2021 10:57 AM

## 2021-10-26 ENCOUNTER — Inpatient Hospital Stay (HOSPITAL_COMMUNITY)
Admission: AD | Admit: 2021-10-26 | Discharge: 2021-10-29 | DRG: 807 | Disposition: A | Discharge status: Home / Self Care | Payer: Medicaid Other | Attending: Obstetrics and Gynecology | Admitting: Obstetrics and Gynecology

## 2021-10-26 ENCOUNTER — Other Ambulatory Visit: Payer: Self-pay

## 2021-10-26 ENCOUNTER — Encounter (HOSPITAL_COMMUNITY): Payer: Self-pay | Admitting: Obstetrics and Gynecology

## 2021-10-26 DIAGNOSIS — O139 Gestational [pregnancy-induced] hypertension without significant proteinuria, unspecified trimester: Secondary | ICD-10-CM | POA: Diagnosis present

## 2021-10-26 DIAGNOSIS — R519 Headache, unspecified: Secondary | ICD-10-CM | POA: Diagnosis present

## 2021-10-26 DIAGNOSIS — Z3689 Encounter for other specified antenatal screening: Secondary | ICD-10-CM

## 2021-10-26 DIAGNOSIS — O133 Gestational [pregnancy-induced] hypertension without significant proteinuria, third trimester: Secondary | ICD-10-CM | POA: Diagnosis present

## 2021-10-26 DIAGNOSIS — O99214 Obesity complicating childbirth: Secondary | ICD-10-CM | POA: Diagnosis present

## 2021-10-26 DIAGNOSIS — Z3A38 38 weeks gestation of pregnancy: Secondary | ICD-10-CM | POA: Diagnosis not present

## 2021-10-26 DIAGNOSIS — O134 Gestational [pregnancy-induced] hypertension without significant proteinuria, complicating childbirth: Principal | ICD-10-CM | POA: Diagnosis present

## 2021-10-26 LAB — URINALYSIS, ROUTINE W REFLEX MICROSCOPIC
Bilirubin Urine: NEGATIVE
Glucose, UA: NEGATIVE mg/dL
Hgb urine dipstick: NEGATIVE
Ketones, ur: NEGATIVE mg/dL
Leukocytes,Ua: NEGATIVE
Nitrite: NEGATIVE
Protein, ur: NEGATIVE mg/dL
Specific Gravity, Urine: 1.01 (ref 1.005–1.030)
pH: 7 (ref 5.0–8.0)

## 2021-10-26 LAB — CBC WITH DIFFERENTIAL/PLATELET
Abs Immature Granulocytes: 0.06 10*3/uL (ref 0.00–0.07)
Basophils Absolute: 0 10*3/uL (ref 0.0–0.1)
Basophils Relative: 0 %
Eosinophils Absolute: 0.1 10*3/uL (ref 0.0–0.5)
Eosinophils Relative: 1 %
HCT: 36.1 % (ref 36.0–46.0)
Hemoglobin: 12.2 g/dL (ref 12.0–15.0)
Immature Granulocytes: 1 %
Lymphocytes Relative: 22 %
Lymphs Abs: 1.6 10*3/uL (ref 0.7–4.0)
MCH: 29.2 pg (ref 26.0–34.0)
MCHC: 33.8 g/dL (ref 30.0–36.0)
MCV: 86.4 fL (ref 80.0–100.0)
Monocytes Absolute: 0.7 10*3/uL (ref 0.1–1.0)
Monocytes Relative: 9 %
Neutro Abs: 4.7 10*3/uL (ref 1.7–7.7)
Neutrophils Relative %: 67 %
Platelets: 247 10*3/uL (ref 150–400)
RBC: 4.18 MIL/uL (ref 3.87–5.11)
RDW: 13.5 % (ref 11.5–15.5)
WBC: 7.1 10*3/uL (ref 4.0–10.5)
nRBC: 0 % (ref 0.0–0.2)

## 2021-10-26 LAB — COMPREHENSIVE METABOLIC PANEL
ALT: 14 U/L (ref 0–44)
AST: 13 U/L — ABNORMAL LOW (ref 15–41)
Albumin: 2.6 g/dL — ABNORMAL LOW (ref 3.5–5.0)
Alkaline Phosphatase: 96 U/L (ref 38–126)
Anion gap: 5 (ref 5–15)
BUN: 7 mg/dL (ref 6–20)
CO2: 24 mmol/L (ref 22–32)
Calcium: 8.7 mg/dL — ABNORMAL LOW (ref 8.9–10.3)
Chloride: 107 mmol/L (ref 98–111)
Creatinine, Ser: 0.74 mg/dL (ref 0.44–1.00)
GFR, Estimated: >60 mL/min (ref >60)
Glucose, Bld: 80 mg/dL (ref 70–99)
Potassium: 3.6 mmol/L (ref 3.5–5.1)
Sodium: 136 mmol/L (ref 135–145)
Total Bilirubin: 0.7 mg/dL (ref 0.3–1.2)
Total Protein: 6.7 g/dL (ref 6.5–8.1)

## 2021-10-26 LAB — PROTEIN / CREATININE RATIO, URINE
Creatinine, Urine: 68 mg/dL
Protein Creatinine Ratio: 0.1 mg/mg{Cre} (ref 0.00–0.15)
Total Protein, Urine: 7 mg/dL

## 2021-10-26 LAB — TYPE AND SCREEN
ABO/RH(D): B POS
Antibody Screen: NEGATIVE

## 2021-10-26 MED ORDER — OXYCODONE-ACETAMINOPHEN 5-325 MG PO TABS: 1.0000 | ORAL_TABLET | ORAL | Status: DC | PRN

## 2021-10-26 MED ORDER — LIDOCAINE HCL (PF) 1 % IJ SOLN: 30.0000 mL | INTRAMUSCULAR | Status: DC | PRN

## 2021-10-26 MED ORDER — TERBUTALINE SULFATE 1 MG/ML IJ SOLN: 0.2500 mg | Freq: Once | INTRAMUSCULAR | Status: DC | PRN

## 2021-10-26 MED ORDER — MISOPROSTOL 25 MCG QUARTER TABLET
25.0000 ug | ORAL_TABLET | ORAL | Status: DC | PRN
  Administered 2021-10-26 – 2021-10-27 (×4): 25 ug via VAGINAL
  Filled 2021-10-26 (×4): qty 1

## 2021-10-26 MED ORDER — OXYTOCIN-SODIUM CHLORIDE 30-0.9 UT/500ML-% IV SOLN: 2.5000 [IU]/h | INTRAVENOUS | Status: DC

## 2021-10-26 MED ORDER — LACTATED RINGERS IV SOLN: INTRAVENOUS | Status: DC

## 2021-10-26 MED ORDER — OXYTOCIN BOLUS FROM INFUSION
333.0000 mL | Freq: Once | INTRAVENOUS | Status: AC
Start: 2021-10-26 — End: 2021-10-27
  Administered 2021-10-27: 333 mL via INTRAVENOUS

## 2021-10-26 MED ORDER — LACTATED RINGERS IV SOLN: 500.0000 mL | INTRAVENOUS | Status: DC | PRN

## 2021-10-26 MED ORDER — OXYCODONE-ACETAMINOPHEN 5-325 MG PO TABS: 2.0000 | ORAL_TABLET | ORAL | Status: DC | PRN

## 2021-10-26 MED ORDER — SOD CITRATE-CITRIC ACID 500-334 MG/5ML PO SOLN: 30.0000 mL | ORAL | Status: DC | PRN

## 2021-10-26 MED ORDER — FENTANYL CITRATE (PF) 100 MCG/2ML IJ SOLN: 50.0000 ug | INTRAMUSCULAR | Status: DC | PRN

## 2021-10-26 MED ORDER — ACETAMINOPHEN 325 MG PO TABS
650.0000 mg | ORAL_TABLET | ORAL | Status: DC | PRN
  Administered 2021-10-26: 650 mg via ORAL
  Filled 2021-10-26: qty 2

## 2021-10-26 MED ORDER — ONDANSETRON HCL 4 MG/2ML IJ SOLN: 4.0000 mg | Freq: Four times a day (QID) | INTRAMUSCULAR | Status: DC | PRN

## 2021-10-26 MED ORDER — ACETAMINOPHEN 325 MG PO TABS
ORAL_TABLET | ORAL | Status: AC
  Filled 2021-10-26: qty 1

## 2021-10-26 NOTE — H&P (Signed)
Jessica Christian is a 21 y.o. female G2P0010 22w3dpresented to MAU today with complaint of headache, 7/10. Per her report, blood pressures at home 130-140/80-90s. No VC or RUQ pain.  No LOF, VB, Contractions. Normal FM.   In MAU, initial BP mildly elevated 143/87. She had mild BP elevation at 30 weeks and during MAU visit last week on 5/18. With multiple elevated blood pressures, met criteria for gestational hypertension and was admitted for induction. Headache improved with tylenol in MAU.   Pregnancy c/b: Obesity: initial BMI 43, on aspirin prophylaxis during pregnancy, last growth 10/06/2021 MFM EFW/BPP: 2396 gm (5#5)17%, AFI 13.4 cm Teenage pregnancy Prediabetes: initial OB HbA1c 6.2, passed early 1 hr and 3rd trimester 1hr GTT   OB History     Gravida  2   Para      Term      Preterm      AB  1   Living         SAB  1   IAB      Ectopic      Multiple      Live Births             Past Medical History:  Diagnosis Date   Medical history non-contributory    Past Surgical History:  Procedure Laterality Date   NO PAST SURGERIES     WISDOM TOOTH EXTRACTION     Family History: family history includes Healthy in her father and mother; Hypertension in her maternal grandmother. Social History:  reports that she has never smoked. She has never used smokeless tobacco. She reports that she does not drink alcohol and does not use drugs.     Maternal Diabetes: No Genetic Screening: Normal Maternal Ultrasounds/Referrals: Normal Fetal Ultrasounds or other Referrals:  Referred to Materal Fetal Medicine for obesity, growth scan as above Maternal Substance Abuse:  No Significant Maternal Medications:  None Significant Maternal Lab Results:  Group B Strep negative Other Comments:  None  Review of Systems Per HPI Exam Physical Exam  Dilation: 1 Effacement (%): 50 Station: -2 Exam by:: Katera Rybka, MD Blood pressure 131/81, pulse 76, temperature 97.9 F (36.6 C),  temperature source Oral, resp. rate 18, height 5' 3"  (1.6 m), weight 116.6 kg, last menstrual period 01/30/2021, unknown if currently breastfeeding. Gen: NAD, resting comfortably CVS: normal pulses Lungs: nonlabored respirations Abd: Gravid abdomen, obese, [place] 7lb Ext; no calf edema or tenderness  Fetal testing: 140bpm, mod variability, + accels, no decels Toco: no contractions Prenatal labs: ABO, Rh:  --/--/B POS (05/22 1050) Antibody: NEG (05/22 1050) Rubella: Immune (11/16 0000) RPR: Nonreactive (03/02 0000)  HBsAg: Negative (11/16 0000)  HIV: Non-reactive (11/16 0000)  GBS: Negative/-- (05/10 0000)   CBC    Component Value Date/Time   WBC 7.1 10/26/2021 1050   RBC 4.18 10/26/2021 1050   HGB 12.2 10/26/2021 1050   HCT 36.1 10/26/2021 1050   PLT 247 10/26/2021 1050   MCV 86.4 10/26/2021 1050   MCH 29.2 10/26/2021 1050   MCHC 33.8 10/26/2021 1050   RDW 13.5 10/26/2021 1050   LYMPHSABS 1.6 10/26/2021 1050   MONOABS 0.7 10/26/2021 1050   EOSABS 0.1 10/26/2021 1050   BASOSABS 0.0 10/26/2021 1050   CMP     Component Value Date/Time   NA 136 10/26/2021 1050   K 3.6 10/26/2021 1050   CL 107 10/26/2021 1050   CO2 24 10/26/2021 1050   GLUCOSE 80 10/26/2021 1050   BUN 7 10/26/2021 1050  CREATININE 0.74 10/26/2021 1050   CALCIUM 8.7 (L) 10/26/2021 1050   PROT 6.7 10/26/2021 1050   ALBUMIN 2.6 (L) 10/26/2021 1050   AST 13 (L) 10/26/2021 1050   ALT 14 10/26/2021 1050   ALKPHOS 96 10/26/2021 1050   BILITOT 0.7 10/26/2021 1050   GFRNONAA >60 10/26/2021 1050   Urine P/C 0.10  Assessment/Plan: 20Y G2P0010 @ [redacted]w[redacted]d IOL GHTN Fetal wellbeing: cat I tracing IOL: received first dose cytotec at 1500, continue q4hr PRN cervical ripening. Discussed cervix is now open enough to place foley bulb, she declined foley bulb at this time.  GHTN: normal preeclampsia labs, monitor blood pressures   MRowland Lathe5/22/2023, 6:56 PM

## 2021-10-26 NOTE — MAU Note (Signed)
.  Jessica Christian is a 21 y.o. at [redacted]w[redacted]d here in MAU reporting: she has been nauseated and having a headache on and off for the past 3 days. Did not take anything for headache. Saw spots one time not today.Good fetal movement felt. Denies any vag bleeding or leaking. B/p at home 130-140/80-90's LMP:  Onset of complaint: 3 days Pain score: 7/10 There were no vitals filed for this visit.   FHT:145 Lab orders placed from triage:u/a

## 2021-10-26 NOTE — MAU Provider Note (Signed)
History     CSN: [id number]  Arrival date and time: 10/26/21 1001   Event Date/Time   First Provider Initiated Contact with Patient 10/26/21 1043      Chief Complaint  Patient presents with   Emesis   Headache   Ms. Jessica Christian is a 21 y.o. G2P0010 at [redacted]w[redacted]d who presents to MAU for preeclampsia evaluation after she developed a HA and nausea that have persisted since her last visit on 10/22/2021 to MAU. Patient states she did not have these symptoms during her MAU visit, but they started after that time. Patient states that she did not take anything for her headache or nausea because she is "not a pill person" and does not want to take medication. Patient meets criteria for gestational HTN today and admission will be recommended. CareEverywhere reviewed and no prenatal records except GBS status available. GBS negative. Patient's mother present for entire visit.  Pt denies blurry vision/seeing spots, vomiting, epigastric pain, swelling in face and hands, sudden weight gain. Pt denies chest pain and SOB.  Pt denies constipation, diarrhea, or urinary problems. Pt denies fever, chills, fatigue, sweating or changes in appetite. Pt denies dizziness, light-headedness, weakness.  Pt denies VB, ctx, LOF and reports good FM.   OB History     Gravida  2   Para      Term      Preterm      AB  1   Living         SAB  1   IAB      Ectopic      Multiple      Live Births              Past Medical History:  Diagnosis Date   Medical history non-contributory     Past Surgical History:  Procedure Laterality Date   NO PAST SURGERIES     WISDOM TOOTH EXTRACTION      Family History  Problem Relation Age of Onset   Healthy Mother    Healthy Father    Hypertension Maternal Grandmother     Social History   Tobacco Use   Smoking status: Never   Smokeless tobacco: Never  Vaping Use   Vaping Use: Never used  Substance Use Topics   Alcohol use: No   Drug use:  Never    Allergies: No Known Allergies  Medications Prior to Admission  Medication Sig Dispense Refill Last Dose   aspirin EC 81 MG tablet Take 81 mg by mouth daily. Swallow whole.      cyclobenzaprine (FLEXERIL) 10 MG tablet Take 1 tablet (10 mg total) by mouth 2 (two) times daily as needed for muscle spasms. 20 tablet 0    Prenatal Vit-Fe Fumarate-FA (MULTIVITAMIN-PRENATAL) 27-0.8 MG TABS tablet Take 1 tablet by mouth daily at 12 noon.       Review of Systems  Constitutional:  Negative for chills, diaphoresis, fatigue and fever.  Eyes:  Negative for visual disturbance.  Respiratory:  Negative for shortness of breath.   Cardiovascular:  Negative for chest pain.  Gastrointestinal:  Positive for nausea. Negative for abdominal pain, constipation, diarrhea and vomiting.  Genitourinary:  Negative for dysuria, flank pain, frequency, pelvic pain, urgency, vaginal bleeding and vaginal discharge.  Neurological:  Positive for headaches. Negative for dizziness, weakness and light-headedness.   Physical Exam   Blood pressure 120/73, pulse 78, temperature 98.2 F (36.8 C), temperature source Oral, resp. rate 18, height 5\' 3"  (1.6 m), weight 116.6  kg, last menstrual period 01/30/2021, unknown if currently breastfeeding.  Patient Vitals for the past 24 hrs:  BP Temp Temp src Pulse Resp Height Weight  10/26/21 1103 120/73 -- -- 78 -- -- --  10/26/21 1046 121/72 -- -- 95 -- -- --  10/26/21 1031 128/82 98.2 F (36.8 C) Oral 94 -- -- --  10/26/21 1014 (!) 143/87 -- -- 92 18 5\' 3"  (1.6 m) 116.6 kg   Physical Exam Vitals and nursing note reviewed.  Constitutional:      General: She is not in acute distress.    Appearance: Normal appearance. She is not ill-appearing, toxic-appearing or diaphoretic.  HENT:     Head: Normocephalic and atraumatic.  Pulmonary:     Effort: Pulmonary effort is normal.  Neurological:     Mental Status: She is alert and oriented to person, place, and time.   Psychiatric:        Mood and Affect: Mood normal.        Behavior: Behavior normal.        Thought Content: Thought content normal.        Judgment: Judgment normal.   MAU Course  Procedures  MDM -pt presenting with HA and nausea -meets criteria for gHTN today -preeclampsia labs drawn -Dr. called to recommend admission, agrees with plan EFM: reactive       -baseline: 135       -variability: moderate       -accels: present, 15x15       -decels: absent       -TOCO: quiet -Pt informed that the ultrasound is considered a limited OB ultrasound and is not intended to be a complete ultrasound exam.  Patient also informed that the ultrasound is not being completed with the intent of assessing for fetal or placental anomalies or any pelvic abnormalities.  Explained that the purpose of today's ultrasound is to assess for  presentation (VTX).  Patient acknowledges the purpose of the exam and the limitations of the study.   -admit for induction  Assessment and Plan   1. Gestational hypertension, third trimester   2. [redacted] weeks gestation of pregnancy   3. NST (non-stress test) reactive    -admit for induction  Jessica Christian Jessica Christian 10/26/2021, 11:09 AM

## 2021-10-27 ENCOUNTER — Inpatient Hospital Stay (HOSPITAL_COMMUNITY): Payer: Medicaid Other | Admitting: Anesthesiology

## 2021-10-27 ENCOUNTER — Ambulatory Visit: Payer: No Typology Code available for payment source

## 2021-10-27 ENCOUNTER — Encounter (HOSPITAL_COMMUNITY): Payer: Self-pay | Admitting: Obstetrics and Gynecology

## 2021-10-27 LAB — RPR: RPR Ser Ql: NONREACTIVE

## 2021-10-27 LAB — CBC
HCT: 41 % (ref 36.0–46.0)
Hemoglobin: 14 g/dL (ref 12.0–15.0)
MCH: 29.2 pg (ref 26.0–34.0)
MCHC: 34.1 g/dL (ref 30.0–36.0)
MCV: 85.4 fL (ref 80.0–100.0)
Platelets: 247 10*3/uL (ref 150–400)
RBC: 4.8 MIL/uL (ref 3.87–5.11)
RDW: 13.6 % (ref 11.5–15.5)
WBC: 8.1 10*3/uL (ref 4.0–10.5)
nRBC: 0 % (ref 0.0–0.2)

## 2021-10-27 MED ORDER — ONDANSETRON HCL 4 MG PO TABS: 4.0000 mg | ORAL_TABLET | ORAL | Status: DC | PRN

## 2021-10-27 MED ORDER — OXYCODONE HCL 5 MG PO TABS: 10.0000 mg | ORAL_TABLET | ORAL | Status: DC | PRN

## 2021-10-27 MED ORDER — SIMETHICONE 80 MG PO CHEW: 80.0000 mg | CHEWABLE_TABLET | ORAL | Status: DC | PRN

## 2021-10-27 MED ORDER — EPHEDRINE 5 MG/ML INJ: 10.0000 mg | INTRAVENOUS | Status: DC | PRN

## 2021-10-27 MED ORDER — PRENATAL MULTIVITAMIN CH
1.0000 | ORAL_TABLET | Freq: Every day | ORAL | Status: DC
  Administered 2021-10-28 – 2021-10-29 (×2): 1 via ORAL
  Filled 2021-10-27 (×2): qty 1

## 2021-10-27 MED ORDER — WITCH HAZEL-GLYCERIN EX PADS: 1.0000 "application " | MEDICATED_PAD | CUTANEOUS | Status: DC | PRN

## 2021-10-27 MED ORDER — LABETALOL HCL 5 MG/ML IV SOLN: 20.0000 mg | INTRAVENOUS | Status: DC | PRN

## 2021-10-27 MED ORDER — SENNOSIDES-DOCUSATE SODIUM 8.6-50 MG PO TABS
2.0000 | ORAL_TABLET | Freq: Every day | ORAL | Status: DC
  Administered 2021-10-28 – 2021-10-29 (×2): 2 via ORAL
  Filled 2021-10-27 (×2): qty 2

## 2021-10-27 MED ORDER — LABETALOL HCL 5 MG/ML IV SOLN: 40.0000 mg | INTRAVENOUS | Status: DC | PRN

## 2021-10-27 MED ORDER — PHENYLEPHRINE 80 MCG/ML (10ML) SYRINGE FOR IV PUSH (FOR BLOOD PRESSURE SUPPORT)
80.0000 ug | PREFILLED_SYRINGE | INTRAVENOUS | Status: DC | PRN
  Filled 2021-10-27: qty 10

## 2021-10-27 MED ORDER — LACTATED RINGERS IV SOLN: 500.0000 mL | Freq: Once | INTRAVENOUS | Status: DC

## 2021-10-27 MED ORDER — COCONUT OIL OIL
1.0000 "application " | TOPICAL_OIL | Status: DC | PRN
  Administered 2021-10-28: 1 via TOPICAL

## 2021-10-27 MED ORDER — MAGNESIUM HYDROXIDE 400 MG/5ML PO SUSP: 30.0000 mL | ORAL | Status: DC | PRN

## 2021-10-27 MED ORDER — METHYLERGONOVINE MALEATE 0.2 MG PO TABS: 0.2000 mg | ORAL_TABLET | ORAL | Status: DC | PRN

## 2021-10-27 MED ORDER — ACETAMINOPHEN 325 MG PO TABS: 650.0000 mg | ORAL_TABLET | ORAL | Status: DC | PRN

## 2021-10-27 MED ORDER — ZOLPIDEM TARTRATE 5 MG PO TABS: 5.0000 mg | ORAL_TABLET | Freq: Every evening | ORAL | Status: DC | PRN

## 2021-10-27 MED ORDER — ONDANSETRON HCL 4 MG/2ML IJ SOLN
4.0000 mg | INTRAMUSCULAR | Status: DC | PRN
Start: 2021-10-27 — End: 2021-10-29

## 2021-10-27 MED ORDER — DIPHENHYDRAMINE HCL 25 MG PO CAPS: 25.0000 mg | ORAL_CAPSULE | Freq: Four times a day (QID) | ORAL | Status: DC | PRN

## 2021-10-27 MED ORDER — BENZOCAINE-MENTHOL 20-0.5 % EX AERO
1.0000 "application " | INHALATION_SPRAY | CUTANEOUS | Status: DC | PRN
  Administered 2021-10-28: 1 via TOPICAL
  Filled 2021-10-27: qty 56

## 2021-10-27 MED ORDER — CYCLOBENZAPRINE HCL 5 MG PO TABS: 10.0000 mg | ORAL_TABLET | Freq: Two times a day (BID) | ORAL | Status: DC | PRN

## 2021-10-27 MED ORDER — TETANUS-DIPHTH-ACELL PERTUSSIS 5-2.5-18.5 LF-MCG/0.5 IM SUSY: 0.5000 mL | PREFILLED_SYRINGE | Freq: Once | INTRAMUSCULAR | Status: DC

## 2021-10-27 MED ORDER — OXYCODONE HCL 5 MG PO TABS: 5.0000 mg | ORAL_TABLET | ORAL | Status: DC | PRN

## 2021-10-27 MED ORDER — TERBUTALINE SULFATE 1 MG/ML IJ SOLN
INTRAMUSCULAR | Status: AC
  Filled 2021-10-27: qty 1

## 2021-10-27 MED ORDER — LIDOCAINE-EPINEPHRINE (PF) 2 %-1:200000 IJ SOLN
INTRAMUSCULAR | Status: DC | PRN
  Administered 2021-10-27: 5 mL via EPIDURAL

## 2021-10-27 MED ORDER — NIFEDIPINE ER OSMOTIC RELEASE 30 MG PO TB24
30.0000 mg | ORAL_TABLET | Freq: Every day | ORAL | Status: DC
  Administered 2021-10-27 – 2021-10-29 (×3): 30 mg via ORAL
  Filled 2021-10-27 (×4): qty 1

## 2021-10-27 MED ORDER — LABETALOL HCL 5 MG/ML IV SOLN: 80.0000 mg | INTRAVENOUS | Status: DC | PRN

## 2021-10-27 MED ORDER — METHYLERGONOVINE MALEATE 0.2 MG/ML IJ SOLN: 0.2000 mg | INTRAMUSCULAR | Status: DC | PRN

## 2021-10-27 MED ORDER — PHENYLEPHRINE 80 MCG/ML (10ML) SYRINGE FOR IV PUSH (FOR BLOOD PRESSURE SUPPORT)
80.0000 ug | PREFILLED_SYRINGE | INTRAVENOUS | Status: DC | PRN
  Administered 2021-10-27: 80 ug via INTRAVENOUS

## 2021-10-27 MED ORDER — FENTANYL-BUPIVACAINE-NACL 0.5-0.125-0.9 MG/250ML-% EP SOLN
12.0000 mL/h | EPIDURAL | Status: DC | PRN
  Administered 2021-10-27: 12 mL/h via EPIDURAL
  Filled 2021-10-27: qty 250

## 2021-10-27 MED ORDER — DIBUCAINE (PERIANAL) 1 % EX OINT: 1.0000 "application " | TOPICAL_OINTMENT | CUTANEOUS | Status: DC | PRN

## 2021-10-27 MED ORDER — HYDRALAZINE HCL 20 MG/ML IJ SOLN: 10.0000 mg | INTRAMUSCULAR | Status: DC | PRN

## 2021-10-27 MED ORDER — OXYTOCIN-SODIUM CHLORIDE 30-0.9 UT/500ML-% IV SOLN
1.0000 m[IU]/min | INTRAVENOUS | Status: DC
  Administered 2021-10-27: 2 m[IU]/min via INTRAVENOUS
  Filled 2021-10-27: qty 500

## 2021-10-27 MED ORDER — DIPHENHYDRAMINE HCL 50 MG/ML IJ SOLN: 12.5000 mg | INTRAMUSCULAR | Status: DC | PRN

## 2021-10-27 MED ORDER — IBUPROFEN 600 MG PO TABS
600.0000 mg | ORAL_TABLET | Freq: Four times a day (QID) | ORAL | Status: DC
  Administered 2021-10-27 – 2021-10-29 (×7): 600 mg via ORAL
  Filled 2021-10-27 (×7): qty 1

## 2021-10-27 MED ORDER — MEASLES, MUMPS & RUBELLA VAC IJ SOLR: 0.5000 mL | Freq: Once | INTRAMUSCULAR | Status: DC

## 2021-10-27 NOTE — Progress Notes (Signed)
Painful ctx, waiting for epidural Afeb, VSS, BP 120-150.70-110, mostly 120-140/70-80 FHT-140s, Cat I, ctx q 2-3 min VE-2/70/-2, vtx per RN On pitocin now, will get epidural, monitor progress, anticipate SVD

## 2021-10-27 NOTE — Anesthesia Procedure Notes (Signed)
Epidural Patient location during procedure: OB Start time: 10/27/2021 2:20 PM End time: 10/27/2021 2:40 PM  Staffing Anesthesiologist: Elmer Picker, MD Performed: anesthesiologist   Preanesthetic Checklist Completed: patient identified, IV checked, risks and benefits discussed, monitors and equipment checked, pre-op evaluation and timeout performed  Epidural Patient position: sitting Prep: DuraPrep and site prepped and draped Patient monitoring: continuous pulse ox, blood pressure, heart rate and cardiac monitor Approach: midline Location: L3-L4 Injection technique: LOR air  Needle:  Needle type: Tuohy  Needle gauge: 17 G Needle length: 9 cm Needle insertion depth: 8 cm Catheter type: closed end flexible Catheter size: 19 Gauge Catheter at skin depth: 15 cm Test dose: negative  Assessment Sensory level: T8 Events: blood not aspirated, injection not painful, no injection resistance, no paresthesia and negative IV test  Additional Notes Patient identified. Risks/Benefits/Options discussed with patient including but not limited to bleeding, infection, nerve damage, paralysis, failed block, incomplete pain control, headache, blood pressure changes, nausea, vomiting, reactions to medication both or allergic, itching and postpartum back pain. Confirmed with bedside nurse the patient's most recent platelet count. Confirmed with patient that they are not currently taking any anticoagulation, have any bleeding history or any family history of bleeding disorders. Patient expressed understanding and wished to proceed. All questions were answered. Sterile technique was used throughout the entire procedure. Please see nursing notes for vital signs. Test dose was given through epidural catheter and negative prior to continuing to dose epidural or start infusion. Warning signs of high block given to the patient including shortness of breath, tingling/numbness in hands, complete motor block,  or any concerning symptoms with instructions to call for help. Patient was given instructions on fall risk and not to get out of bed. All questions and concerns addressed with instructions to call with any issues or inadequate analgesia.    Difficult epidural placement 2/2 poor pt positioning related to pain from contractions. Attempts at L3/4 and L4/5 with successful placement at L3/4. Reason for block:procedure for pain

## 2021-10-27 NOTE — Progress Notes (Signed)
OB Progress Note  S: mild cramping   O: Today's Vitals   10/26/21 2255 10/26/21 2334 10/26/21 2347 10/27/21 0156  BP: (!) 123/58  125/75 (!) 141/72  Pulse: 72  72 69  Resp:    18  Temp: 98.6 F (37 C)     TempSrc: Oral     Weight:      Height:      PainSc:  Asleep     Body mass index is 45.53 kg/m.  SVE by RN 1.5/70/-2  FHR: 140bpm, mod variability, + accels, no decels Toco: ctx q 1-6 mins   A/P: 20Y G2P0010 @ [redacted]w[redacted]d, IOL GHTN Fetal wellbeing: cat I tracing IOL: s/p cytotec x3. I continue to recommend foley bulb to aid in cervical ripening, patient continues to decline. Requested RN place cytotec #4.  GHTN: normal preeclampsia labs, blood pressures normal to mildly elevated.  Alinda Deem, MD 10/27/21 4:11 AM

## 2021-10-27 NOTE — Progress Notes (Signed)
Comfortable with epidural Afeb, VSS, BP 110-140/50-90 FHT-130s, mod variability, + accels and scalp stim, some prolonged and probable early decels, Cat II, ctx not tracing well VE-9/C/+1 per RN Is making progress without pitocin, anticipate SVD

## 2021-10-27 NOTE — Lactation Note (Signed)
This note was copied from a baby's chart. Lactation Consultation Note Assisted baby to the breast. Baby latched easily. Mom stated she has "something coming out of her breast". LC demonstrated hand expression and praised mom telling that is colostrum. Mom is happy about seeing that. Mom's choice of feeding is Breast/formula. Encouraged to BF before giving formula. Baby has low temp so warm blankets applied and baby STS. Noted nasal flares. Reported to RN. Will f/u mom on MBU. Mom stated she is feeling slightly nauseated.   Patient Name: Jessica Christian Today's Date: 10/27/2021 Reason for consult: L&D Initial assessment;Primapara;Early term 37-38.6wks Age:61 hours  Maternal Data Has patient been taught Hand Expression?: Yes Does the patient have breastfeeding experience prior to this delivery?: No  Feeding    LATCH Score Latch: Grasps breast easily, tongue down, lips flanged, rhythmical sucking.  Audible Swallowing: None  Type of Nipple: Everted at rest and after stimulation  Comfort (Breast/Nipple): Soft / non-tender  Hold (Positioning): Assistance needed to correctly position infant at breast and maintain latch.  LATCH Score: 7   Lactation Tools Discussed/Used    Interventions Interventions: Adjust position;Assisted with latch;Support pillows;Skin to skin;Position options;Breast massage;Hand express;Breast compression  Discharge    Consult Status Consult Status: Follow-up from L&D Date: 10/28/21 Follow-up type: In-patient    Charyl Dancer 10/27/2021, 8:16 PM

## 2021-10-27 NOTE — Anesthesia Preprocedure Evaluation (Signed)
Anesthesia Evaluation  Patient identified by MRN, date of birth, ID band Patient awake    Reviewed: Allergy & Precautions, NPO status , Patient's Chart, lab work & pertinent test results  Airway Mallampati: III  TM Distance: >3 FB Neck ROM: Full    Dental no notable dental hx. (+) Teeth Intact, Dental Advisory Given   Pulmonary neg pulmonary ROS,    Pulmonary exam normal breath sounds clear to auscultation       Cardiovascular hypertension (gHTN), Normal cardiovascular exam Rhythm:Regular Rate:Normal     Neuro/Psych negative neurological ROS  negative psych ROS   GI/Hepatic negative GI ROS, Neg liver ROS,   Endo/Other  Morbid obesity (BMI 46)  Renal/GU negative Renal ROS  negative genitourinary   Musculoskeletal negative musculoskeletal ROS (+)   Abdominal   Peds  Hematology negative hematology ROS (+)   Anesthesia Other Findings IOL for gHTN  Reproductive/Obstetrics (+) Pregnancy                             Anesthesia Physical Anesthesia Plan  ASA: 3  Anesthesia Plan: Epidural   Post-op Pain Management:    Induction:   PONV Risk Score and Plan: Treatment may vary due to age or medical condition  Airway Management Planned: Natural Airway  Additional Equipment:   Intra-op Plan:   Post-operative Plan:   Informed Consent: I have reviewed the patients History and Physical, chart, labs and discussed the procedure including the risks, benefits and alternatives for the proposed anesthesia with the patient or authorized representative who has indicated his/her understanding and acceptance.       Plan Discussed with: Anesthesiologist  Anesthesia Plan Comments: (Patient identified. Risks, benefits, options discussed with patient including but not limited to bleeding, infection, nerve damage, paralysis, failed block, incomplete pain control, headache, blood pressure changes,  nausea, vomiting, reactions to medication, itching, and post partum back pain. Confirmed with bedside nurse the patient's most recent platelet count. Confirmed with the patient that they are not taking any anticoagulation, have any bleeding history or any family history of bleeding disorders. Patient expressed understanding and wishes to proceed. All questions were answered. )        Anesthesia Quick Evaluation

## 2021-10-28 LAB — CBC
HCT: 36.9 % (ref 36.0–46.0)
Hemoglobin: 12.1 g/dL (ref 12.0–15.0)
MCH: 28.1 pg (ref 26.0–34.0)
MCHC: 32.8 g/dL (ref 30.0–36.0)
MCV: 85.8 fL (ref 80.0–100.0)
Platelets: 237 10*3/uL (ref 150–400)
RBC: 4.3 MIL/uL (ref 3.87–5.11)
RDW: 13.6 % (ref 11.5–15.5)
WBC: 10.1 10*3/uL (ref 4.0–10.5)
nRBC: 0 % (ref 0.0–0.2)

## 2021-10-28 NOTE — Progress Notes (Signed)
Post Partum Day 1 Subjective: no complaints, up ad lib, voiding, and tolerating PO  Objective: Blood pressure 121/78, pulse 74, temperature 97.8 F (36.6 C), temperature source Oral, resp. rate 16, height 5\' 3"  (1.6 m), weight 116.6 kg, last menstrual period 01/30/2021, SpO2 100 %, unknown if currently breastfeeding.  Physical Exam:  General: alert, cooperative, and appears stated age Lochia: appropriate Uterine Fundus: firm DVT Evaluation: No evidence of DVT seen on physical exam.  Recent Labs    10/27/21 1315 10/28/21 0536  HGB 14.0 12.1  HCT 41.0 36.9    Assessment/Plan: Plan for discharge tomorrow Breastfeeding IOL for gestational HTN, BPs normotensive since delivery    LOS: 2 days   10/30/21 10/28/2021, 10:45 AM

## 2021-10-28 NOTE — Lactation Note (Signed)
This note was copied from a baby's chart. Lactation Consultation Note Set up pump. Patient Name: Jessica Christian Today's Date: 10/28/2021 Reason for consult: Initial assessment;Primapara;Early term 37-38.6wks Age:20 hours  Maternal Data    Feeding    LATCH Score Latch: Grasps breast easily, tongue down, lips flanged, rhythmical sucking.  Audible Swallowing: None  Type of Nipple: Everted at rest and after stimulation  Comfort (Breast/Nipple): Soft / non-tender  Hold (Positioning): Assistance needed to correctly position infant at breast and maintain latch.  LATCH Score: 7   Lactation Tools Discussed/Used    Interventions Interventions: Breast feeding basics reviewed;Assisted with latch;Breast massage;Breast compression;Adjust position;Support pillows;Position options;LC Services brochure  Discharge    Consult Status Consult Status: Follow-up Date: 10/28/21 Follow-up type: In-patient    Dylann Gallier, Diamond Nickel 10/28/2021, 1:00 AM

## 2021-10-28 NOTE — Lactation Note (Addendum)
This note was copied from a baby's chart. Lactation Consultation Note Assisted baby in latching. Baby latched well and BF for about 5 minutes then fell asleep. Mom can express colostrum. Praised mom. Encouraged mom to flange lips. Newborn feeding habits, body alignment, STS, I&O, support, supply and demand reviewed. Mom encouraged to feed baby 8-12 times a day. Mom is breast/formula. Encouraged to BF first before supplementing. Mom will call for latch assistance when needed.  Patient Name: Girl Kaia Depaolis Today's Date: 10/28/2021 Reason for consult: Initial assessment;Primapara;Early term 37-38.6wks Age:20 hours  Maternal Data Has patient been taught Hand Expression?: Yes Does the patient have breastfeeding experience prior to this delivery?: No  Feeding    LATCH Score Latch: Grasps breast easily, tongue down, lips flanged, rhythmical sucking.  Audible Swallowing: None  Type of Nipple: Everted at rest and after stimulation  Comfort (Breast/Nipple): Soft / non-tender  Hold (Positioning): Assistance needed to correctly position infant at breast and maintain latch.  LATCH Score: 7   Lactation Tools Discussed/Used Tools: Shells;Pump;Nipple Shields Nipple shield size: 20 Breast pump type: Double-Electric Breast Pump Pump Education:  (mom to tired to pump/set up pump) Reason for Pumping: short shaft/NS Pumping frequency: q 3hr in am.  Interventions Interventions: Breast feeding basics reviewed;Assisted with latch;Breast massage;Breast compression;Adjust position;Support pillows;Position options;LC Services brochure;DEBP;Shells  Discharge    Consult Status Consult Status: Follow-up Date: 10/28/21 Follow-up type: In-patient    Yesli Vanderhoff, Diamond Nickel 10/28/2021, 1:51 AM

## 2021-10-28 NOTE — Progress Notes (Signed)
BP 121/ 78 Dr Tenny Craw is aware that pt received first dose of procardia 30 mg on 5/23@2332 . Dr Tenny Craw states to give procardia now to start daily dose for 1000. Procardia is given per Dr Charlott Rakes request.

## 2021-10-28 NOTE — Anesthesia Postprocedure Evaluation (Signed)
Anesthesia Post Note  Patient: Engineer, civil (consulting)  Procedure(s) Performed: AN AD HOC LABOR EPIDURAL     Patient location during evaluation: Mother Baby Anesthesia Type: Epidural Level of consciousness: awake and alert Pain management: pain level controlled Vital Signs Assessment: post-procedure vital signs reviewed and stable Respiratory status: spontaneous breathing, nonlabored ventilation and respiratory function stable Cardiovascular status: stable Postop Assessment: no headache, no backache and epidural receding Anesthetic complications: no   No notable events documented.  Last Vitals:  Vitals:   10/28/21 0230 10/28/21 0632  BP: 127/80 115/83  Pulse: 68 75  Resp: 17 16  Temp: 36.7 C 36.7 C  SpO2: 100% 100%    Last Pain:  Vitals:   10/28/21 0733  TempSrc:   PainSc: 0-No pain   Pain Goal:                   Jessica Christian

## 2021-10-28 NOTE — Lactation Note (Signed)
This note was copied from a baby's chart. Lactation Consultation Note  Patient Name: Jessica Christian [id number] Date: 10/28/2021 Reason for consult: Follow-up assessment;Early term 37-38.6wks Age:20 hours  Mom tearful when LC entered due to infant not eating.  Mom recently pumped and collected 2 ml.  And is able to hand express well.  LC assisted with latching in football hold.  Multiple attempts needed for baby to begin sucking.  Once latched, a couple of swallows were heard but with breast compression.    Mom was able to latch independently on the second breast during visit. Mom attempted to bottle feed after the first breast and baby would not suck. LC did suck assessment, infant would not begin sucking gloved finger.  BAby did open to latch to bottle.  Baby took several sucks then the bottle was removed in order for infant to take a breath.  Long inhalation noted. Baby opened again to feed but leaking was observed and seal wasn't formed well around the bottle and suck was shallow around the nipple and uncoordinated.     46ml total taken from the bottle.  RN at bedside. LC reported feeding difficulty and provided will be notified and request for SLP.    MOm was encouraged to pump every 3 hours.   Maternal Data Has patient been taught Hand Expression?: Yes  Feeding Mother's Current Feeding Choice: Breast Milk and Formula  LATCH Score Latch: Repeated attempts needed to sustain latch, nipple held in mouth throughout feeding, stimulation needed to elicit sucking reflex.  Audible Swallowing: A few with stimulation (only a couple heard)  Type of Nipple: Everted at rest and after stimulation (easily compressible.  needs teacup hold to evert tissue for ease of latching)  Comfort (Breast/Nipple): Soft / non-tender  Hold (Positioning): Assistance needed to correctly position infant at breast and maintain latch.  LATCH Score: 7   Lactation Tools Discussed/Used Tools: Pump Breast  pump type: Double-Electric Breast Pump Pump Education: Setup, frequency, and cleaning;Milk Storage Reason for Pumping: stimlulate milk supply Pumping frequency: encouraged every 3 hours Pumped volume: 2 mL  Interventions Interventions: Skin to skin;Hand express;Breast massage;Breast compression  Discharge Pump: DEBP  Consult Status Consult Status: Follow-up Date: 10/29/21 Follow-up type: In-patient    Ferne Coe [facility] 10/28/2021, 2:57 PM

## 2021-10-29 MED ORDER — IBUPROFEN 600 MG PO TABS: 600.0000 mg | ORAL_TABLET | Freq: Four times a day (QID) | ORAL | 0 refills | Status: DC | PRN

## 2021-10-29 MED ORDER — NIFEDIPINE ER 30 MG PO TB24: 30.0000 mg | ORAL_TABLET | Freq: Every day | ORAL | 0 refills | Status: DC

## 2021-10-29 NOTE — Discharge Summary (Signed)
Postpartum Discharge Summary  Date of Service updated      Patient Name: Jessica Christian DOB: 12-18-2000 MRN: [id number]  Date of admission: 10/26/2021 Delivery date:10/27/2021  Delivering provider: Lavina Hamman  Date of discharge: 10/29/2021  Admitting diagnosis: Gestational hypertension [O13.9] Intrauterine pregnancy: [redacted]w[redacted]d     Secondary diagnosis:  Principal Problem:   Gestational hypertension Active Problems:   Gestational hypertension, third trimester  Additional problems: none    Discharge diagnosis: Term Pregnancy Delivered and Gestational Hypertension                                              Post partum procedures: n/a Augmentation: Pitocin and Cytotec Complications: None  Hospital course: Induction of Labor With Vaginal Delivery   21 y.o. yo G2P1011 at [redacted]w[redacted]d was admitted to the hospital 10/26/2021 for induction of labor.  Indication for induction: Gestational hypertension.  Patient had an uncomplicated labor course as follows: Membrane Rupture Time/Date: 4:08 PM ,10/27/2021   Delivery Method:Vaginal, Spontaneous  Episiotomy: None  Lacerations:  2nd degree;Perineal  Details of delivery can be found in separate delivery note.  Patient had a routine postpartum course. Patient is discharged home 10/29/21.  Newborn Data: Birth date:10/27/2021  Birth time:7:10 PM  Gender:Female  Living status:Living  Apgars:8 ,9  Weight:2670 g   Magnesium Sulfate received: No BMZ received: No  Physical exam  Vitals:   10/28/21 1625 10/28/21 2045 10/28/21 2332 10/29/21 0540  BP: 127/83 132/83 130/84 120/72  Pulse: 88 87 90 84  Resp: 16 16 18 16   Temp: 98.2 F (36.8 C) 98.3 F (36.8 C) 98 F (36.7 C) 98.1 F (36.7 C)  TempSrc: Oral  Oral Oral  SpO2:  100% 100%   Weight:      Height:       General: alert, cooperative, and no distress Lochia: appropriate Uterine Fundus: firm Incision: N/A DVT Evaluation: No evidence of DVT seen on physical exam. Labs: Lab Results   Component Value Date   WBC 10.1 10/28/2021   HGB 12.1 10/28/2021   HCT 36.9 10/28/2021   MCV 85.8 10/28/2021   PLT 237 10/28/2021      Latest Ref Rng & Units 10/26/2021   10:50 AM  CMP  Glucose 70 - 99 mg/dL 80    BUN 6 - 20 mg/dL 7    Creatinine 10/28/2021 - 1.00 mg/dL 4.66    Sodium 5.99 - 357 mmol/L 136    Potassium 3.5 - 5.1 mmol/L 3.6    Chloride 98 - 111 mmol/L 107    CO2 22 - 32 mmol/L 24    Calcium 8.9 - 10.3 mg/dL 8.7    Total Protein 6.5 - 8.1 g/dL 6.7    Total Bilirubin 0.3 - 1.2 mg/dL 0.7    Alkaline Phos 38 - 126 U/L 96    AST 15 - 41 U/L 13    ALT 0 - 44 U/L 14     Edinburgh Score:     View : No data to display.            After visit meds:  Allergies as of 10/29/2021   No Known Allergies      Medication List     STOP taking these medications    aspirin EC 81 MG tablet       TAKE these medications    cyclobenzaprine 10 MG  tablet Commonly known as: FLEXERIL Take 1 tablet (10 mg total) by mouth 2 (two) times daily as needed for muscle spasms.   ibuprofen 600 MG tablet Commonly known as: ADVIL Take 1 tablet (600 mg total) by mouth every 6 (six) hours as needed for moderate pain or cramping.   multivitamin-prenatal 27-0.8 MG Tabs tablet Take 1 tablet by mouth daily at 12 noon.   NIFEdipine 30 MG 24 hr tablet Commonly known as: ADALAT CC Take 1 tablet (30 mg total) by mouth daily.         Discharge home in stable condition Infant Feeding: Breast Infant Disposition:home with mother Discharge instruction: per After Visit Summary and Postpartum booklet. Activity: Advance as tolerated. Pelvic rest for 6 weeks.  Diet: low salt diet Anticipated Birth Control: Unsure Postpartum Appointment:6 weeks Additional Postpartum F/U: Postpartum Depression checkup and BP check 1 week Future Appointments:No future appointments. Follow up Visit:  Follow-up Information     Ob/Gyn, Nestor Ramp. Schedule an appointment as soon as possible for a visit.    Why: 1 week for BP check and 6 weeks for postpartum visit Contact information: [address] [place] [address] [phone]                     10/29/2021 Cathrine Muster, DO

## 2021-10-29 NOTE — Discharge Instructions (Signed)
Call office with any concerns [phone] 

## 2021-10-29 NOTE — Progress Notes (Signed)
Post Partum Day 2 Subjective: no complaints, up ad lib, voiding, tolerating PO, + flatus, and lochia mild. She denies HA, CP, SOB. She is bonding well with baby; breastfeeding   Objective: Blood pressure 120/72, pulse 84, temperature 98.1 F (36.7 C), temperature source Oral, resp. rate 16, height 5\' 3"  (1.6 m), weight 116.6 kg, last menstrual period 01/30/2021, SpO2 100 %, unknown if currently breastfeeding.  Physical Exam:  General: alert, cooperative, and no distress Lochia: appropriate Uterine Fundus: firm Incision: n/a DVT Evaluation: No evidence of DVT seen on physical exam.  Recent Labs    10/27/21 1315 10/28/21 0536  HGB 14.0 12.1  HCT 41.0 36.9    Assessment/Plan: Discharge home and Breastfeeding   LOS: 3 days   Ruhi Kopke W Kaniah Rizzolo 10/29/2021, 11:02 AM

## 2021-11-04 ENCOUNTER — Ambulatory Visit: Payer: No Typology Code available for payment source

## 2021-11-05 ENCOUNTER — Telehealth (HOSPITAL_COMMUNITY): Payer: Self-pay | Admitting: *Deleted

## 2021-11-05 NOTE — Telephone Encounter (Signed)
Mom reports feeling good. No concerns about herself at this time. EPDS=4 [facility] score=3) Mom reports baby is doing well. Feeding, peeing, and pooping without difficulty. Safe sleep reviewed. Mom reports no concerns about baby at present.  Duffy Rhody, RN 11-05-2021 at 9:41am

## 2021-11-22 ENCOUNTER — Ambulatory Visit: Payer: Self-pay

## 2022-06-07 NOTE — L&D Delivery Note (Addendum)
DELIVERY NOTE  Pt complete and at +2 station with urge to push. Labored quickly after epidural placement, minimal pain control at time of delivery. Pt pushed and delivered a viable female infant in LOA position. Clear SROM during crowning, therefore not effectively GBS treated. Anterior and posterior shoulders spontaneously delivered with next two pushes; body easily followed next. Infant placed on mothers abdomen and bulb suction of mouth and nose performed. Cord was then clamped and cut by MD. Cord blood obtained, 3VC. Baby had a vigorous spontaneous cry noted. Placenta then delivered at 1326 intact. Fundal massage performed and pitocin per protocol. Fundus firm. The following lacerations were noted: NONE, EBL 200. Mother and baby stable. Counts correct   Infant time: 32 Gender: female, desires circ Placenta time: 1326 Apgars: 9/9 Weight: pending skin-to-skin  Immediately after delivery, short cord noted. No excess traction during delivery of infant however umbilical vein with possible shearing within Wharton's jelly, evidence of developing hematoma noted. To path BP elevated during delivery, however had just had phenylephrine s/p epidural placement and was shaking. Close eye on BPs

## 2022-06-10 ENCOUNTER — Inpatient Hospital Stay (HOSPITAL_COMMUNITY): Payer: Medicaid Other

## 2022-06-10 ENCOUNTER — Encounter (HOSPITAL_COMMUNITY): Payer: Self-pay | Admitting: Family Medicine

## 2022-06-10 ENCOUNTER — Inpatient Hospital Stay (HOSPITAL_COMMUNITY)
Admission: AD | Admit: 2022-06-10 | Discharge: 2022-06-10 | Disposition: A | Discharge status: Home / Self Care | Payer: Medicaid Other | Attending: Family Medicine | Admitting: Family Medicine

## 2022-06-10 DIAGNOSIS — O209 Hemorrhage in early pregnancy, unspecified: Secondary | ICD-10-CM | POA: Diagnosis not present

## 2022-06-10 DIAGNOSIS — O26891 Other specified pregnancy related conditions, first trimester: Secondary | ICD-10-CM

## 2022-06-10 DIAGNOSIS — Z79899 Other long term (current) drug therapy: Secondary | ICD-10-CM | POA: Insufficient documentation

## 2022-06-10 DIAGNOSIS — R109 Unspecified abdominal pain: Secondary | ICD-10-CM | POA: Diagnosis not present

## 2022-06-10 DIAGNOSIS — Z3A08 8 weeks gestation of pregnancy: Secondary | ICD-10-CM | POA: Insufficient documentation

## 2022-06-10 DIAGNOSIS — R1030 Lower abdominal pain, unspecified: Secondary | ICD-10-CM | POA: Diagnosis present

## 2022-06-10 HISTORY — DX: Gestational (pregnancy-induced) hypertension without significant proteinuria, unspecified trimester: O13.9

## 2022-06-10 LAB — URINALYSIS, ROUTINE W REFLEX MICROSCOPIC
Bilirubin Urine: NEGATIVE
Glucose, UA: NEGATIVE mg/dL
Ketones, ur: NEGATIVE mg/dL
Nitrite: NEGATIVE
Protein, ur: NEGATIVE mg/dL
Specific Gravity, Urine: 1.024 (ref 1.005–1.030)
pH: 6 (ref 5.0–8.0)

## 2022-06-10 LAB — WET PREP, GENITAL
Clue Cells Wet Prep HPF POC: NONE SEEN
Sperm: NONE SEEN
Trich, Wet Prep: NONE SEEN
WBC, Wet Prep HPF POC: >=10 — AB (ref <10)
Yeast Wet Prep HPF POC: NONE SEEN

## 2022-06-10 LAB — HCG, QUANTITATIVE, PREGNANCY: hCG, Beta Chain, Quant, S: 49310 m[IU]/mL — ABNORMAL HIGH (ref <5)

## 2022-06-10 LAB — CBC
HCT: 40.8 % (ref 36.0–46.0)
Hemoglobin: 13.4 g/dL (ref 12.0–15.0)
MCH: 28.3 pg (ref 26.0–34.0)
MCHC: 32.8 g/dL (ref 30.0–36.0)
MCV: 86.3 fL (ref 80.0–100.0)
Platelets: 319 10*3/uL (ref 150–400)
RBC: 4.73 MIL/uL (ref 3.87–5.11)
RDW: 13.4 % (ref 11.5–15.5)
WBC: 6 10*3/uL (ref 4.0–10.5)
nRBC: 0 % (ref 0.0–0.2)

## 2022-06-10 NOTE — MAU Provider Note (Addendum)
Chief Complaint: Vaginal Bleeding, Abdominal Pain, and Possible Pregnancy   Event Date/Time   First Provider Initiated Contact with Patient 06/10/22 1335      SUBJECTIVE HPI: Jessica Christian is a 21 y.o. G3P1011 at [redacted]w[redacted]d by unsure LMP due to irregular periods with breastfeeding who presents to maternity admissions reporting lower abdominal cramping and spotting when wiping that started today.  There are no other symptoms.    HPI  Past Medical History:  Diagnosis Date   Pregnancy induced hypertension    Past Surgical History:  Procedure Laterality Date   WISDOM TOOTH EXTRACTION     Social History   Socioeconomic History   Marital status: Single    Spouse name: Not on file   Number of children: Not on file   Years of education: Not on file   Highest education level: Not on file  Occupational History   Not on file  Tobacco Use   Smoking status: Never   Smokeless tobacco: Never  Vaping Use   Vaping Use: Never used  Substance and Sexual Activity   Alcohol use: No   Drug use: Never   Sexual activity: Yes    Birth control/protection: None  Other Topics Concern   Not on file  Social History Narrative   Not on file   Social Determinants of Health   Financial Resource Strain: Not on file  Food Insecurity: Not on file  Transportation Needs: Not on file  Physical Activity: Not on file  Stress: Not on file  Social Connections: Not on file  Intimate Partner Violence: Not on file   No current facility-administered medications on file prior to encounter.   Current Outpatient Medications on File Prior to Encounter  Medication Sig Dispense Refill   Prenatal Vit-Fe Fumarate-FA (MULTIVITAMIN-PRENATAL) 27-0.8 MG TABS tablet Take 1 tablet by mouth daily at 12 noon.     cyclobenzaprine (FLEXERIL) 10 MG tablet Take 1 tablet (10 mg total) by mouth 2 (two) times daily as needed for muscle spasms. 20 tablet 0   ibuprofen (ADVIL) 600 MG tablet Take 1 tablet (600 mg total) by mouth every  6 (six) hours as needed for moderate pain or cramping. 30 tablet 0   NIFEdipine (ADALAT CC) 30 MG 24 hr tablet Take 1 tablet (30 mg total) by mouth daily. 30 tablet 0   No Known Allergies  ROS:  Review of Systems  Constitutional:  Negative for chills, fatigue and fever.  Respiratory:  Negative for shortness of breath.   Cardiovascular:  Negative for chest pain.  Gastrointestinal:  Positive for abdominal pain.  Genitourinary:  Positive for vaginal bleeding. Negative for difficulty urinating, dysuria, flank pain, pelvic pain, vaginal discharge and vaginal pain.  Neurological:  Negative for dizziness and headaches.  Psychiatric/Behavioral: Negative.       I have reviewed patient's Past Medical Hx, Surgical Hx, Family Hx, Social Hx, medications and allergies.   Physical Exam  Patient Vitals for the past 24 hrs:  BP Temp Temp src Pulse Resp SpO2 Height Weight  06/10/22 1137 137/68 98.5 F (36.9 C) Oral 75 18 100 % 5\' 3"  (1.6 m) 112.4 kg   Constitutional: Well-developed, well-nourished female in no acute distress.  Cardiovascular: normal rate Respiratory: normal effort GI: Abd soft, non-tender. Pos BS x 4 MS: Extremities nontender, no edema, normal ROM Neurologic: Alert and oriented x 4.  GU: Neg CVAT.  PELVIC EXAM: vaginal cultures collected by self swab   LAB RESULTS Results for orders placed or performed during the hospital  encounter of 06/10/22 (from the past 24 hour(s))  Urinalysis, Routine w reflex microscopic Urine, Clean Catch     Status: Abnormal   Collection Time: 06/10/22 12:05 PM  Result Value Ref Range   Color, Urine YELLOW YELLOW   APPearance HAZY (A) CLEAR   Specific Gravity, Urine 1.024 1.005 - 1.030   pH 6.0 5.0 - 8.0   Glucose, UA NEGATIVE NEGATIVE mg/dL   Hgb urine dipstick SMALL (A) NEGATIVE   Bilirubin Urine NEGATIVE NEGATIVE   Ketones, ur NEGATIVE NEGATIVE mg/dL   Protein, ur NEGATIVE NEGATIVE mg/dL   Nitrite NEGATIVE NEGATIVE   Leukocytes,Ua TRACE  (A) NEGATIVE   RBC / HPF 0-5 0 - 5 RBC/hpf   WBC, UA 6-10 0 - 5 WBC/hpf   Bacteria, UA RARE (A) NONE SEEN   Squamous Epithelial / HPF 6-10 0 - 5 /HPF   Mucus PRESENT   Wet prep, genital     Status: Abnormal   Collection Time: 06/10/22 12:20 PM   Specimen: PATH Cytology Cervicovaginal Ancillary Only  Result Value Ref Range   Yeast Wet Prep HPF POC NONE SEEN NONE SEEN   Trich, Wet Prep NONE SEEN NONE SEEN   Clue Cells Wet Prep HPF POC NONE SEEN NONE SEEN   WBC, Wet Prep HPF POC >=10 (A) <10   Sperm NONE SEEN   CBC     Status: None   Collection Time: 06/10/22  1:41 PM  Result Value Ref Range   WBC 6.0 4.0 - 10.5 K/uL   RBC 4.73 3.87 - 5.11 MIL/uL   Hemoglobin 13.4 12.0 - 15.0 g/dL   HCT 40.8 36.0 - 46.0 %   MCV 86.3 80.0 - 100.0 fL   MCH 28.3 26.0 - 34.0 pg   MCHC 32.8 30.0 - 36.0 g/dL   RDW 13.4 11.5 - 15.5 %   Platelets 319 150 - 400 K/uL   nRBC 0.0 0.0 - 0.2 %    --/--/B POS (05/22 1050)  IMAGING US OB LESS THAN 14 WEEKS WITH OB TRANSVAGINAL  Result Date: 06/10/2022 CLINICAL DATA:  Abdominal pain in pregnancy EXAM: OBSTETRIC <14 WK [place] AND TRANSVAGINAL OB US TECHNIQUE: Both transabdominal and transvaginal ultrasound examinations were performed for complete evaluation of the gestation as well as the maternal uterus, adnexal regions, and pelvic cul-de-sac. Transvaginal technique was performed to assess early pregnancy. COMPARISON:  None Available. FINDINGS: Intrauterine gestational sac: Single Yolk sac:  Seen Embryo:  Seen Cardiac Activity: Seen Heart Rate: 131 bpm CRL:  8.4 mm   6 w   5 d                  [place] EDC: 01/29/2023 Subchorionic hemorrhage:  None visualized. Maternal uterus/adnexae: Cervix is closed. No sonographic abnormality is seen in right ovary. There is 1.3 cm follicle in right ovary. Left ovary is not sonographically visualized. Small amount of free fluid is seen in pelvis, possibly due to recent rupture of ovarian cyst or follicle. IMPRESSION: Single live intrauterine  pregnancy seen. Sonographically estimated gestational age is 6 weeks 5 days. Small amount of free fluid in pelvis may be due to recent rupture of ovarian cyst or follicle. No adnexal masses are seen. Left ovary is not sonographically visualized. Electronically Signed   By: Elmer Picker M.D.   On: 06/10/2022 14:16    MAU Management/MDM: Orders Placed This Encounter  Procedures   Wet prep, genital   US OB LESS THAN 14 WEEKS WITH OB TRANSVAGINAL   Urinalysis, Routine  w reflex microscopic Urine, Clean Catch   CBC   hCG, quantitative, pregnancy   Discharge patient    No orders of the defined types were placed in this encounter.   [place] with viable IUP today.  Wet prep negative, GCC pending.  Pt to f/u with prenatal care, plans to start care at [facility].  Return to MAU as needed for worsening symptoms.    ASSESSMENT 1. Abdominal pain during pregnancy in first trimester   2. Vaginal bleeding in pregnancy, first trimester   3. [redacted] weeks gestation of pregnancy     PLAN Discharge home Allergies as of 06/10/2022   No Known Allergies      Medication List     STOP taking these medications    ibuprofen 600 MG tablet Commonly known as: ADVIL       TAKE these medications    cyclobenzaprine 10 MG tablet Commonly known as: FLEXERIL Take 1 tablet (10 mg total) by mouth 2 (two) times daily as needed for muscle spasms.   multivitamin-prenatal 27-0.8 MG Tabs tablet Take 1 tablet by mouth daily at 12 noon.   NIFEdipine 30 MG 24 hr tablet Commonly known as: ADALAT CC Take 1 tablet (30 mg total) by mouth daily.        Follow-up Information     Leftwich-Kirby, Wilmer Floor, CNM .   Specialty: Obstetrics and Gynecology Contact information: [address] [place] [address] [phone]         Prenatal provider of your choice. Schedule an appointment as soon as possible for a visit.   Why: Start prenatal care as soon as possible                Sharen Counter Certified Nurse-Midwife 06/10/2022  2:51 PM

## 2022-06-10 NOTE — MAU Note (Signed)
Jessica Christian is a 22 y.o. at Unknown here in MAU reporting: +HPT last night.  Today has had some cramps and seen some blood when she wiped (pinkish brown, some bright red spots). Has been very nauseated.  LMP: irreg due to breastfeeding. ? Early November Onset of complaint: this morning Pain score: mild Vitals:   06/10/22 1137  BP: 137/68  Pulse: 75  Resp: 18  Temp: 98.5 F (36.9 C)  SpO2: 100%      Lab orders placed from triage:  UPT/ UA if + Vag swabs.  Child was born May 2023

## 2022-06-11 LAB — GC/CHLAMYDIA PROBE AMP (~~LOC~~) NOT AT ARMC
Chlamydia: NEGATIVE
Comment: NEGATIVE
Comment: NORMAL
Neisseria Gonorrhea: NEGATIVE

## 2022-07-05 ENCOUNTER — Telehealth: Payer: Medicaid Other

## 2022-07-09 ENCOUNTER — Telehealth: Payer: Self-pay | Admitting: Obstetrics and Gynecology

## 2022-07-09 ENCOUNTER — Encounter (HOSPITAL_COMMUNITY): Payer: Self-pay | Admitting: Obstetrics and Gynecology

## 2022-07-09 ENCOUNTER — Inpatient Hospital Stay (HOSPITAL_COMMUNITY)
Admission: AD | Admit: 2022-07-09 | Discharge: 2022-07-09 | Disposition: A | Discharge status: Home / Self Care | Payer: Medicaid Other | Attending: Obstetrics and Gynecology | Admitting: Obstetrics and Gynecology

## 2022-07-09 ENCOUNTER — Other Ambulatory Visit: Payer: Self-pay | Admitting: Obstetrics and Gynecology

## 2022-07-09 DIAGNOSIS — O26891 Other specified pregnancy related conditions, first trimester: Secondary | ICD-10-CM | POA: Insufficient documentation

## 2022-07-09 DIAGNOSIS — O161 Unspecified maternal hypertension, first trimester: Secondary | ICD-10-CM

## 2022-07-09 DIAGNOSIS — Z3A10 10 weeks gestation of pregnancy: Secondary | ICD-10-CM | POA: Insufficient documentation

## 2022-07-09 DIAGNOSIS — O99891 Other specified diseases and conditions complicating pregnancy: Secondary | ICD-10-CM | POA: Insufficient documentation

## 2022-07-09 DIAGNOSIS — O98511 Other viral diseases complicating pregnancy, first trimester: Secondary | ICD-10-CM | POA: Diagnosis not present

## 2022-07-09 DIAGNOSIS — O23591 Infection of other part of genital tract in pregnancy, first trimester: Secondary | ICD-10-CM | POA: Insufficient documentation

## 2022-07-09 DIAGNOSIS — J069 Acute upper respiratory infection, unspecified: Secondary | ICD-10-CM

## 2022-07-09 DIAGNOSIS — O98811 Other maternal infectious and parasitic diseases complicating pregnancy, first trimester: Secondary | ICD-10-CM | POA: Insufficient documentation

## 2022-07-09 DIAGNOSIS — B3731 Acute candidiasis of vulva and vagina: Secondary | ICD-10-CM | POA: Diagnosis not present

## 2022-07-09 DIAGNOSIS — B9689 Other specified bacterial agents as the cause of diseases classified elsewhere: Secondary | ICD-10-CM

## 2022-07-09 DIAGNOSIS — R030 Elevated blood-pressure reading, without diagnosis of hypertension: Secondary | ICD-10-CM | POA: Insufficient documentation

## 2022-07-09 DIAGNOSIS — O99511 Diseases of the respiratory system complicating pregnancy, first trimester: Secondary | ICD-10-CM | POA: Insufficient documentation

## 2022-07-09 LAB — WET PREP, GENITAL
Sperm: NONE SEEN
Trich, Wet Prep: NONE SEEN
WBC, Wet Prep HPF POC: >=10 — AB (ref <10)
Yeast Wet Prep HPF POC: NONE SEEN

## 2022-07-09 MED ORDER — TERCONAZOLE 0.8 % VA CREA: 1.0000 | TOPICAL_CREAM | Freq: Every day | VAGINAL | 0 refills | Status: DC

## 2022-07-09 MED ORDER — METRONIDAZOLE 500 MG PO TABS: 500.0000 mg | ORAL_TABLET | Freq: Two times a day (BID) | ORAL | 0 refills | Status: DC

## 2022-07-09 NOTE — MAU Provider Note (Addendum)
History     CSN: [id number]  Arrival date and time: 07/09/22 1352   Event Date/Time   First Provider Initiated Contact with Patient 07/09/22 1447      Chief Complaint  Patient presents with   Vaginal Discharge   Chest Pain    ribs   Vaginal Discharge The patient's primary symptoms include vaginal discharge. Pertinent negatives include no chills or fever.  Chest Pain  Pertinent negatives include no fever.    22 y/o F G3P1011 [redacted]w[redacted]d presents to the ED with complaints of vaginal itching and thick white discharge beginning a couple days ago and has been worsening since. Pt says she tried coconut oil for the itching with no relief. Pt also notes some mild lower rib pain that is reproducible with palpation and exacerbated by coughing. Pt notes that she has had a cough and congestion for a couple days that seems to be gradually improving. Denies fever, chills, abd pain, SOB, or n/v/d. Denies any vaginal bleeding.   She has an appointment to establish care with a prenatal appointment next week on 07/15/22.    Past Medical History:  Diagnosis Date   Pregnancy induced hypertension     Past Surgical History:  Procedure Laterality Date   WISDOM TOOTH EXTRACTION      Family History  Problem Relation Age of Onset   Healthy Mother    Healthy Father    Hypertension Maternal Grandmother     Social History   Tobacco Use   Smoking status: Never   Smokeless tobacco: Never  Vaping Use   Vaping Use: Never used  Substance Use Topics   Alcohol use: No   Drug use: Never    Allergies: No Known Allergies  No medications prior to admission.    Review of Systems  Constitutional:  Negative for chills and fever.  HENT:  Positive for congestion, postnasal drip and rhinorrhea.   Cardiovascular:  Positive for chest pain.  Genitourinary:  Positive for vaginal discharge.   Physical Exam   Blood pressure 138/85, pulse 100, temperature 98.4 F (36.9 C), temperature source Oral, resp.  rate 20, height 5\' 3"  (1.6 m), weight 112.6 kg, last menstrual period 04/13/2022, SpO2 99 %, currently breastfeeding.  Physical Exam Vitals reviewed.  Constitutional:      Appearance: She is well-developed.  HENT:     Head: Normocephalic and atraumatic.  Cardiovascular:     Rate and Rhythm: Normal rate and regular rhythm.     Heart sounds: Normal heart sounds. Heart sounds not distant. No murmur heard. Pulmonary:     Effort: Pulmonary effort is normal.  Abdominal:     General: Bowel sounds are normal.     Palpations: Abdomen is soft.  Musculoskeletal:     Cervical back: Neck supple.  Skin:    General: Skin is warm and dry.  Neurological:     Mental Status: She is alert.     MAU Course  Procedures  MDM Clumpy white discharge sx consistent with a yeast infection. Also sounds like pt has an unspecified viral cold that is improving.  Lastly, pt was noted to have a couple mild range elevated Bps with no symptoms. Has a history of gestational HTN in her last pregnancy. Would not rule her in for cHTN at this point, but recommended the pt discuss this at her new OB appointment next week.   Assessment and Plan  Vaginal candidiasis - Terazol sent to pharmacy. Discussed vaginal hygine.  Elevated blood pressure affecting pregnancy in  first trimester, antepartum - Advised pt to discuss this at her initial OB appointment next week.  Viral upper respiratory tract infection - Discussed sx managment   Abram Sander 07/09/2022, 3:45 PM

## 2022-07-09 NOTE — Telephone Encounter (Signed)
TC to discuss BV results and the need to treat that first prior to treating vaginal yeast infection symptoms. All questions answered. Patient verbalized an understanding of the plan of care and agrees.   Laury Deep, CNM  07/09/2022 5:33 PM

## 2022-07-09 NOTE — MAU Note (Signed)
Jessica Christian is a 22 y.o. at [redacted]w[redacted]d here in MAU reporting: thinks she has a yeast infection, itching and burning with white clumpy d/c.  Has pain in lower ribs on left side, hurts when she coughs or takes a deep breath-started 3 days ago..  Onset of complaint: wk ago Pain score: 7 Vitals:   07/09/22 1407  BP: (!) 143/82  Pulse: (!) 108  Resp: 20  Temp: 98.4 F (36.9 C)  SpO2: 99%     FHT:172 Lab orders placed from triage:  none

## 2022-07-09 NOTE — Discharge Instructions (Signed)
With vulvar hygiene, go as simple as possible.   Recommend cotton underwear during the day, no underwear at night; unscented detergents and soaps; only water on vulva; latex condoms and provide own lube with silicone lube; avoid panty liners every day, only during periods . Coconut oil makes for excellent personal moisturizer and lubricant.

## 2022-07-12 LAB — GC/CHLAMYDIA PROBE AMP (~~LOC~~) NOT AT ARMC
Chlamydia: POSITIVE — AB
Comment: NEGATIVE
Comment: NORMAL
Neisseria Gonorrhea: NEGATIVE

## 2022-07-13 ENCOUNTER — Telehealth: Payer: Self-pay | Admitting: Obstetrics and Gynecology

## 2022-07-13 NOTE — Telephone Encounter (Signed)
TC to notify of (+) CT results and the need for a RX. Patient informed me that she received a Rx from [facility] for Azithromycin on 07/10/2022 and she took the medication the same day. Discussed she needs to inform all sexual partners of dx and they will need to have treatment. Advised to avoid unprotected SI with all sexual partners until all have been treated. Patient expressed, "I don't ever want to have sex ever again after all of this. I just want my baby to be ok and healthy." Agreed with patient and further emphasized abstaining from unprotected SI until 1 week after treatment has been completed. Patient verbalized an understanding of the plan of care and agrees.   Laury Deep, CNM  07/13/2022 7:01 PM

## 2022-07-15 LAB — OB RESULTS CONSOLE RPR: RPR: NONREACTIVE

## 2022-07-15 LAB — OB RESULTS CONSOLE RUBELLA ANTIBODY, IGM: Rubella: IMMUNE

## 2022-07-15 LAB — HEPATITIS C ANTIBODY: HCV Ab: NEGATIVE

## 2022-07-15 LAB — OB RESULTS CONSOLE HIV ANTIBODY (ROUTINE TESTING): HIV: NONREACTIVE

## 2022-07-15 LAB — OB RESULTS CONSOLE HEPATITIS B SURFACE ANTIGEN: Hepatitis B Surface Ag: NEGATIVE

## 2022-07-21 ENCOUNTER — Other Ambulatory Visit: Payer: Self-pay

## 2022-08-03 LAB — OB RESULTS CONSOLE GC/CHLAMYDIA
Chlamydia: NEGATIVE
Neisseria Gonorrhea: NEGATIVE

## 2022-08-06 ENCOUNTER — Other Ambulatory Visit: Payer: Self-pay | Admitting: Obstetrics and Gynecology

## 2022-08-06 DIAGNOSIS — Z363 Encounter for antenatal screening for malformations: Secondary | ICD-10-CM

## 2022-08-12 ENCOUNTER — Inpatient Hospital Stay (HOSPITAL_COMMUNITY)
Admission: AD | Admit: 2022-08-12 | Discharge: 2022-08-12 | Disposition: A | Discharge status: Home / Self Care | Payer: Medicaid Other | Attending: Obstetrics and Gynecology | Admitting: Obstetrics and Gynecology

## 2022-08-12 ENCOUNTER — Other Ambulatory Visit: Payer: Self-pay

## 2022-08-12 ENCOUNTER — Encounter (HOSPITAL_COMMUNITY): Payer: Self-pay | Admitting: Obstetrics and Gynecology

## 2022-08-12 DIAGNOSIS — Z3A15 15 weeks gestation of pregnancy: Secondary | ICD-10-CM | POA: Diagnosis not present

## 2022-08-12 DIAGNOSIS — W109XXA Fall (on) (from) unspecified stairs and steps, initial encounter: Secondary | ICD-10-CM | POA: Insufficient documentation

## 2022-08-12 DIAGNOSIS — R102 Pelvic and perineal pain: Secondary | ICD-10-CM | POA: Diagnosis not present

## 2022-08-12 DIAGNOSIS — W19XXXD Unspecified fall, subsequent encounter: Secondary | ICD-10-CM

## 2022-08-12 DIAGNOSIS — N949 Unspecified condition associated with female genital organs and menstrual cycle: Secondary | ICD-10-CM | POA: Diagnosis not present

## 2022-08-12 DIAGNOSIS — O26892 Other specified pregnancy related conditions, second trimester: Secondary | ICD-10-CM | POA: Diagnosis present

## 2022-08-12 NOTE — MAU Note (Signed)
Jessica Christian is a 22 y.o. at 80w5dhere in MAU reporting: she fell yesterday landing initially onto her knees then onto her buttocks.  Reports since fall her abdomen hurts when she attempts to twist to either side, no pain otherwise.  Denies VB or LOF. LMP: NA Onset of complaint: yesterday Pain score: 0 Vitals:   08/12/22 1134  BP: 133/86  Pulse: 85  Temp: 98.1 F (36.7 C)  SpO2: 99%     FHT:151 bpm Lab orders placed from triage:   None

## 2022-08-12 NOTE — Discharge Instructions (Signed)
Return to care  If you have heavier bleeding that soaks through more than 2 pads per hour for an hour or more If you bleed so much that you feel like you might pass out or you do pass out If you have significant abdominal pain that is not improved with Tylenol   

## 2022-08-12 NOTE — MAU Provider Note (Signed)
History     [id number]  Arrival date and time: 08/12/22 1123    Chief Complaint  Patient presents with   Fall     HPI Jessica Christian is a 22 y.o. at 38w5dby 6 week ultrasound who presents for abdominal pain. States she fell down a few steps yesterday while at work.  Landed on her knees and then her bottom.  This morning she woke up and the lower sides of her abdomen are painful when she twists from side-to-side.  No abdominal pain, nausea pain.  Has not treated symptoms.  Denies vaginal bleeding.  [facility]   Review of discharge summary from last MAU visit on 2/2   --/--/B POS (05/22 1050)  OB History     Gravida  3   Para  1   Term  1   Preterm      AB  1   Living  1      SAB  1   IAB      Ectopic      Multiple  0   Live Births  1           Past Medical History:  Diagnosis Date   Pregnancy induced hypertension     Past Surgical History:  Procedure Laterality Date   WISDOM TOOTH EXTRACTION      Family History  Problem Relation Age of Onset   Healthy Mother    Healthy Father    Hypertension Maternal Grandmother     Social History   Socioeconomic History   Marital status: Single    Spouse name: Not on file   Number of children: Not on file   Years of education: Not on file   Highest education level: Not on file  Occupational History   Not on file  Tobacco Use   Smoking status: Never   Smokeless tobacco: Never  Vaping Use   Vaping Use: Never used  Substance and Sexual Activity   Alcohol use: No   Drug use: Never   Sexual activity: Yes    Birth control/protection: None  Other Topics Concern   Not on file  Social History Narrative   Not on file   Social Determinants of Health   Financial Resource Strain: Not on file  Food Insecurity: Not on file  Transportation Needs: Not on file  Physical Activity: Not on file  Stress: Not on file  Social Connections: Not on file   Intimate Partner Violence: Not on file    No Known Allergies  No current facility-administered medications on file prior to encounter.   Current Outpatient Medications on File Prior to Encounter  Medication Sig Dispense Refill   promethazine-dextromethorphan (PROMETHAZINE-DM) 6.25-15 MG/5ML syrup Take 5 mLs by mouth every 4 (four) hours.     Prenatal Vit-Fe Fumarate-FA (MULTIVITAMIN-PRENATAL) 27-0.8 MG TABS tablet Take 1 tablet by mouth daily at 12 noon.     terconazole (TERAZOL 3) 0.8 % vaginal cream Place 1 applicator vaginally at bedtime. 20 g 0     ROS Pertinent positives and negative per HPI, all others reviewed and negative  Physical Exam   BP 133/86 (BP Location: Right Arm)   Pulse 85   Temp 98.1 F (36.7 C) (Oral)   Ht '5\' 3"'$  (1.6 m)   Wt 113.7 kg   LMP 04/13/2022 (Approximate)   SpO2 99%   BMI 44.39 kg/m   Patient Vitals for the past 24 hrs:  BP Temp Temp  src Pulse SpO2 Height Weight  08/12/22 1134 133/86 98.1 F (36.7 C) Oral 85 99 % -- --  08/12/22 1130 -- -- -- -- -- '5\' 3"'$  (1.6 m) 113.7 kg    Physical Exam Vitals and nursing note reviewed.  Constitutional:      General: She is not in acute distress.    Appearance: She is well-developed. She is not ill-appearing.  HENT:     Head: Normocephalic and atraumatic.  Eyes:     General: No scleral icterus.       Right eye: No discharge.        Left eye: No discharge.     Conjunctiva/sclera: Conjunctivae normal.  Pulmonary:     Effort: Pulmonary effort is normal. No respiratory distress.  Abdominal:     Palpations: Abdomen is soft.     Tenderness: There is abdominal tenderness in the right lower quadrant and left lower quadrant. There is no guarding or rebound.  Neurological:     General: No focal deficit present.     Mental Status: She is alert.  Psychiatric:        Mood and Affect: Mood normal.        Behavior: Behavior normal.      Labs No results found for this or any previous visit (from the  past 24 hour(s)).  Imaging No results found.  MAU Course  Procedures Lab Orders  No laboratory test(s) ordered today   No orders of the defined types were placed in this encounter.  Imaging Orders  No imaging studies ordered today    MDM mild  Assessment and Plan   1. Fall, subsequent encounter  -Patient fell yesterday. Low impact. Did not hit abdomen. Denies vaginal bleeding. States she has low bilateral abdominal vs pelvic pain only when she twists side to side. No vaginal bleeding. Some TTP over ligaments. FHT present via doppler. Patient reassured.   2. Round ligament pain  -Recommend maternity support belt  3. [redacted] weeks gestation of pregnancy     #FWB: FHT 151 per doppler    Dispo: discharged to home in stable condition.   Discharge Instructions     Discharge patient   Complete by: As directed    Discharge disposition: 01-Home or Self Care   Discharge patient date: 08/12/2022       Jorje Guild, NP 08/12/22 2:10 PM  Allergies as of 08/12/2022   No Known Allergies      Medication List     TAKE these medications    multivitamin-prenatal 27-0.8 MG Tabs tablet Take 1 tablet by mouth daily at 12 noon.   promethazine-dextromethorphan 6.25-15 MG/5ML syrup Commonly known as: PROMETHAZINE-DM Take 5 mLs by mouth every 4 (four) hours.   terconazole 0.8 % vaginal cream Commonly known as: TERAZOL 3 Place 1 applicator vaginally at bedtime.

## 2022-08-20 ENCOUNTER — Inpatient Hospital Stay (HOSPITAL_COMMUNITY)
Admission: AD | Admit: 2022-08-20 | Discharge: 2022-08-20 | Discharge status: Against Medical Advice | Payer: Medicaid Other | Attending: Obstetrics | Admitting: Obstetrics

## 2022-08-20 DIAGNOSIS — O26892 Other specified pregnancy related conditions, second trimester: Secondary | ICD-10-CM | POA: Diagnosis not present

## 2022-08-20 DIAGNOSIS — Y939 Activity, unspecified: Secondary | ICD-10-CM | POA: Insufficient documentation

## 2022-08-20 DIAGNOSIS — Z3A16 16 weeks gestation of pregnancy: Secondary | ICD-10-CM | POA: Diagnosis not present

## 2022-08-20 DIAGNOSIS — W19XXXA Unspecified fall, initial encounter: Secondary | ICD-10-CM | POA: Insufficient documentation

## 2022-08-20 LAB — URINALYSIS, ROUTINE W REFLEX MICROSCOPIC
Bilirubin Urine: NEGATIVE
Glucose, UA: NEGATIVE mg/dL
Hgb urine dipstick: NEGATIVE
Ketones, ur: NEGATIVE mg/dL
Leukocytes,Ua: NEGATIVE
Nitrite: NEGATIVE
Protein, ur: NEGATIVE mg/dL
Specific Gravity, Urine: 1.018 (ref 1.005–1.030)
pH: 6 (ref 5.0–8.0)

## 2022-08-20 NOTE — MAU Note (Signed)
Report given to ED charge nurse Gerald Stabs.

## 2022-08-20 NOTE — MAU Note (Addendum)
..  Jessica Christian is a 22 y.o. at [redacted]w[redacted]d here in MAU reporting: around 6pm she was into her family members house and fell outside. Hit her knees on ground (has a lesion on her knee) and hit her head on the car, reports she did not have a direct blow to the abdomen, but does have some lower abdominal cramping.  Denies vaginal bleeding or leaking of fluid.   Pain score: 9/10 knees, 7/10 head, 3/10 abdominal pain Vitals:   08/20/22 1930  BP: 126/74  Pulse: 92  Resp: 18  Temp: 98.5 F (36.9 C)  SpO2: 99%     FHT:150 Lab orders placed from triage:  UA

## 2022-08-20 NOTE — MAU Provider Note (Signed)
Patient fell and hit her head. Initially she had headache and vision changes.  No nausea and vomiting.  Discussed patient with Dr. Dina Rich in the [facility] ED, will send over for evaluation.  Lezlie Lye, NP 08/20/2022 8:19 PM

## 2022-09-17 ENCOUNTER — Ambulatory Visit: Payer: Medicaid Other | Admitting: *Deleted

## 2022-09-17 ENCOUNTER — Ambulatory Visit: Payer: Medicaid Other | Attending: Obstetrics and Gynecology

## 2022-09-17 ENCOUNTER — Encounter: Payer: Self-pay | Admitting: *Deleted

## 2022-09-17 ENCOUNTER — Other Ambulatory Visit: Payer: Self-pay | Admitting: *Deleted

## 2022-09-17 VITALS — BP 140/87 | HR 101

## 2022-09-17 DIAGNOSIS — O99212 Obesity complicating pregnancy, second trimester: Secondary | ICD-10-CM | POA: Diagnosis present

## 2022-09-17 DIAGNOSIS — Z363 Encounter for antenatal screening for malformations: Secondary | ICD-10-CM | POA: Diagnosis present

## 2022-09-17 DIAGNOSIS — R638 Other symptoms and signs concerning food and fluid intake: Secondary | ICD-10-CM

## 2022-09-28 IMAGING — US US OB TRANSVAGINAL
1 series · 15 of 25 positions shown · non-contrast
Comparison: 10/30/2020

CLINICAL DATA: Vaginal bleeding

EXAM:
TRANSVAGINAL OB ULTRASOUND
TECHNIQUE: Transvaginal ultrasound was performed for complete evaluation of the
gestation as well as the maternal uterus, adnexal regions, and
pelvic cul-de-sac.

[Series 1: us ob transvaginal · 25 acquisitions, 15 frames shown]
[im 1/25]
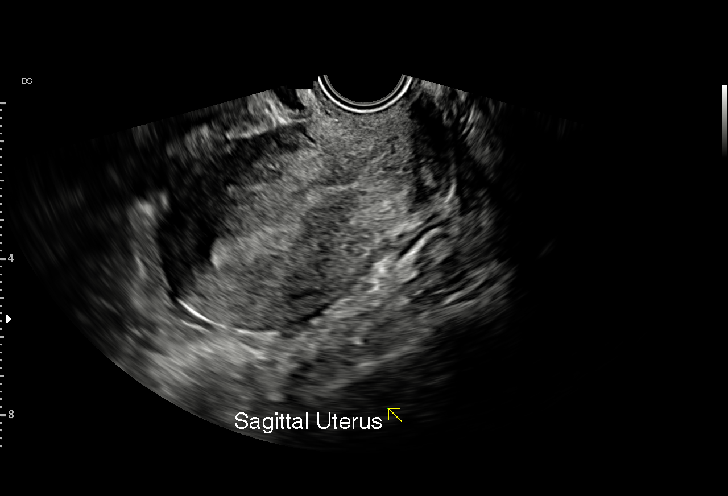
[im 3/25]
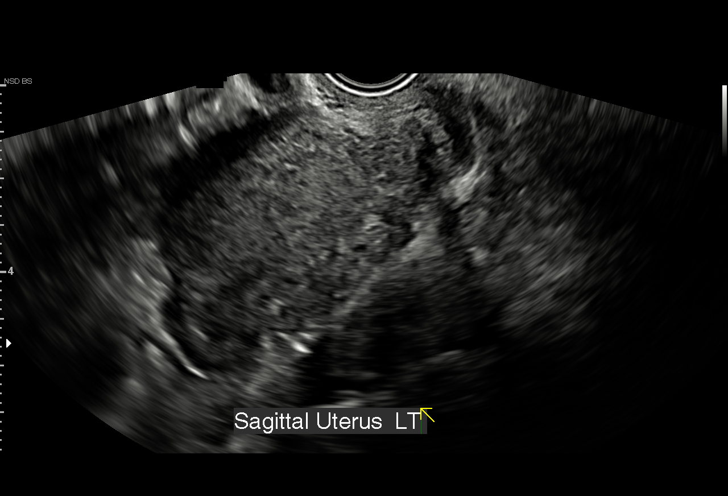
[im 5/25]
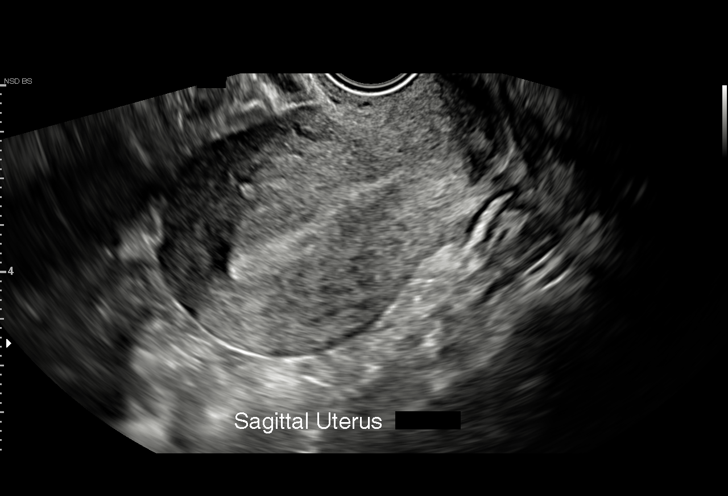
[im 6/25]
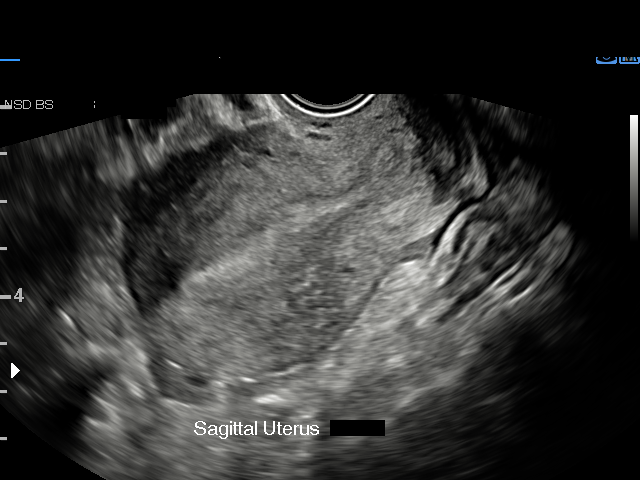
[im 8/25]
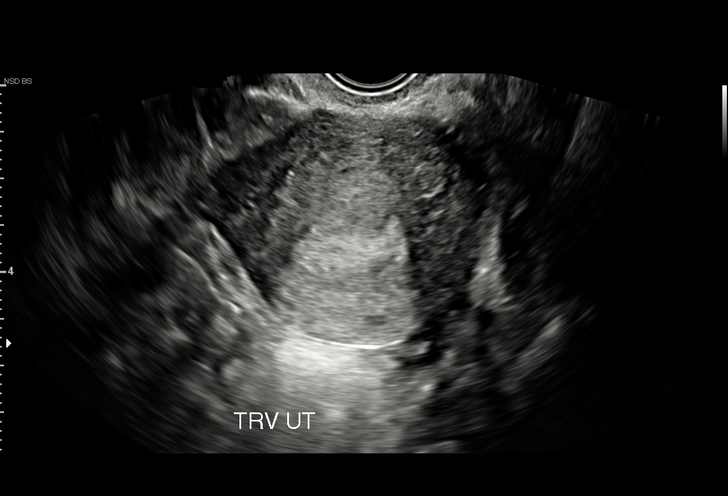
[im 10/25]
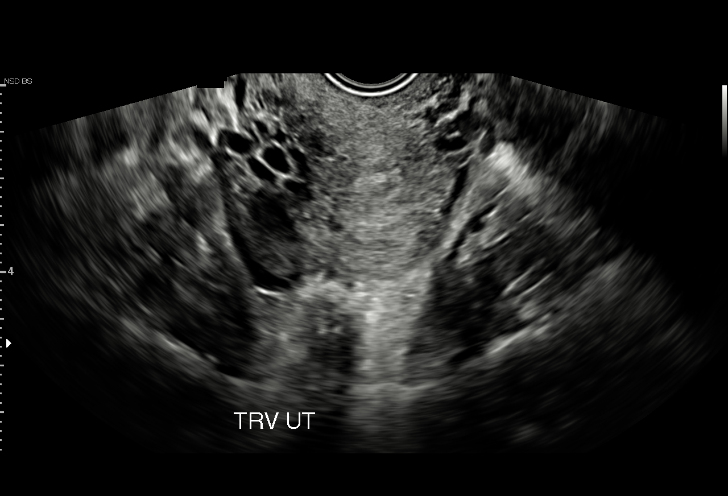
[im 11/25]
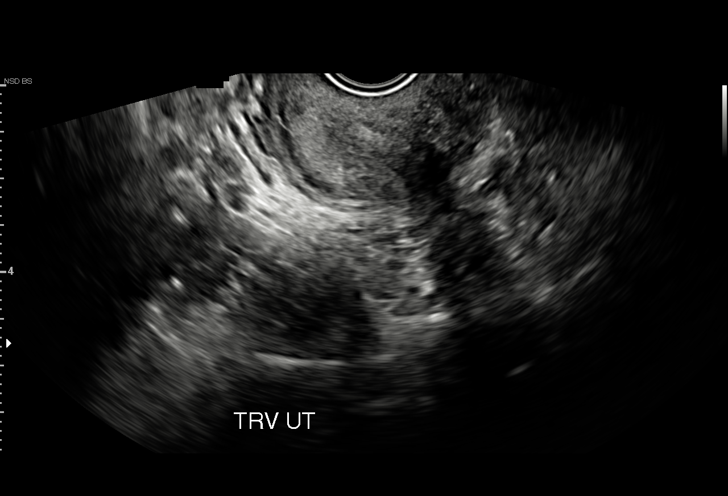
[im 13/25]
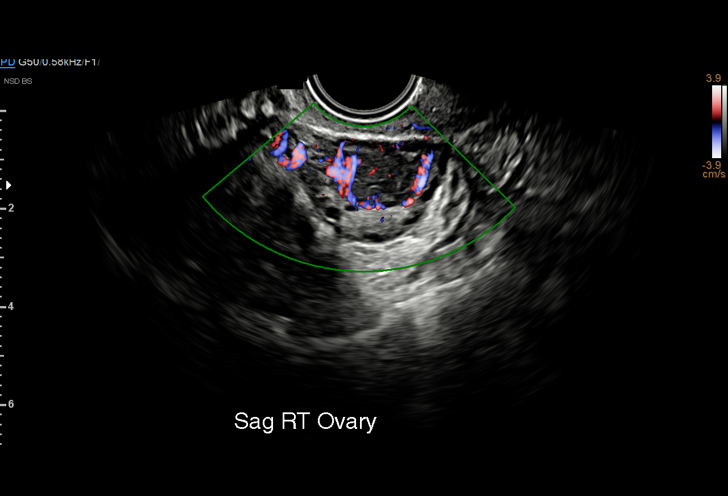
[im 15/25]
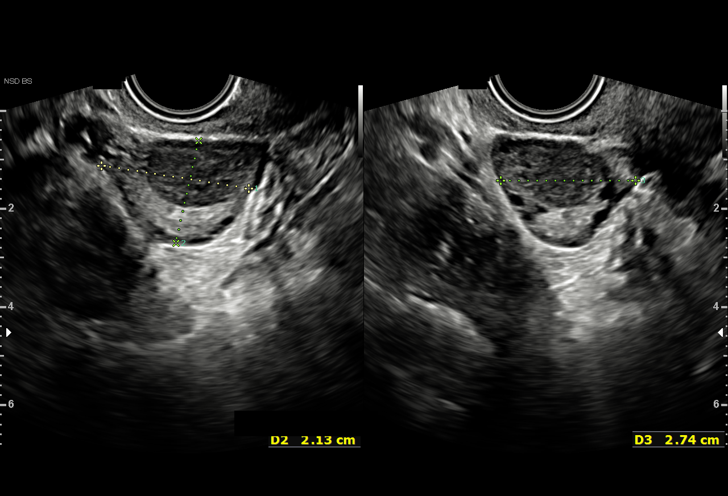
[im 16/25]
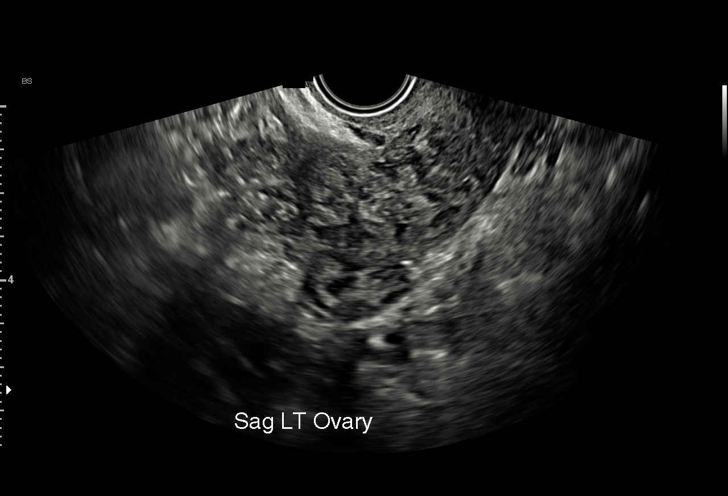
[im 18/25]
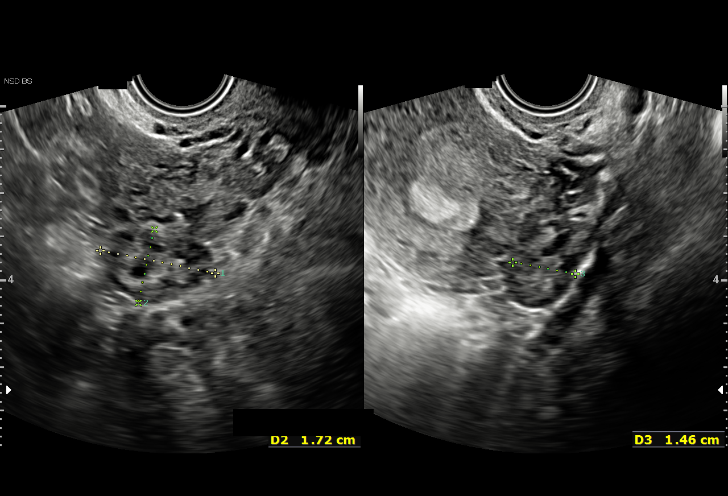
[im 20/25]
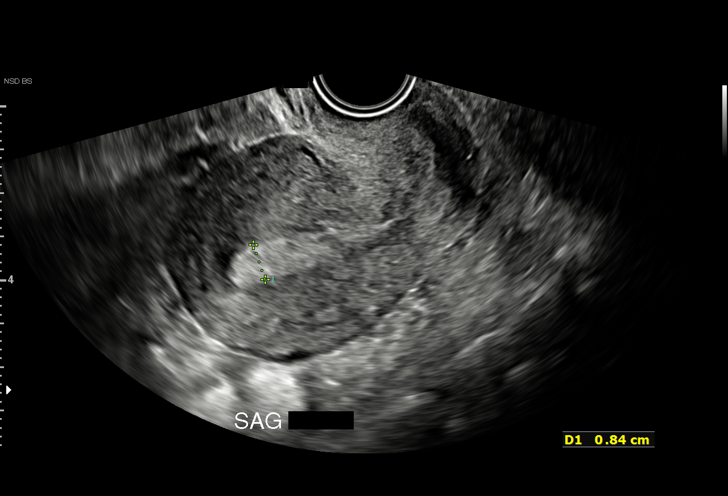
[im 21/25]
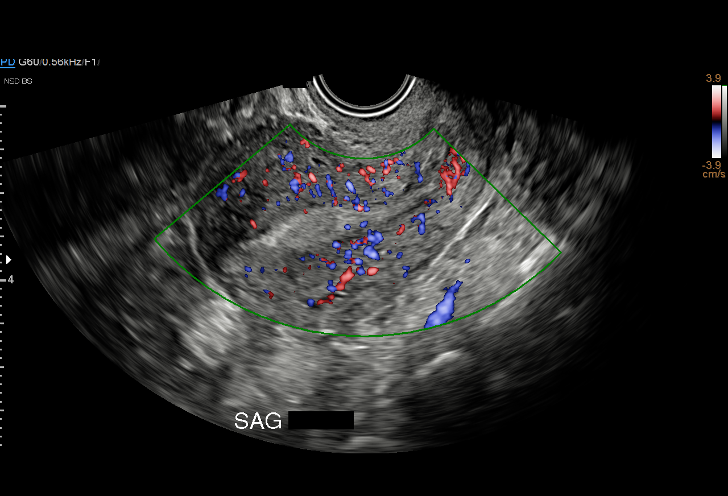
[im 23/25]
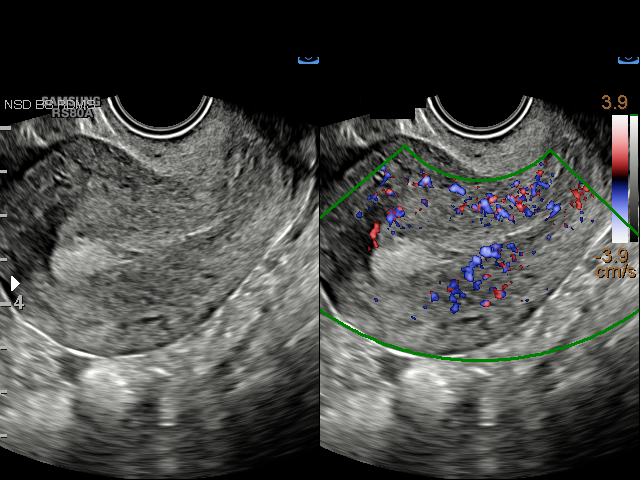
[im 25/25]
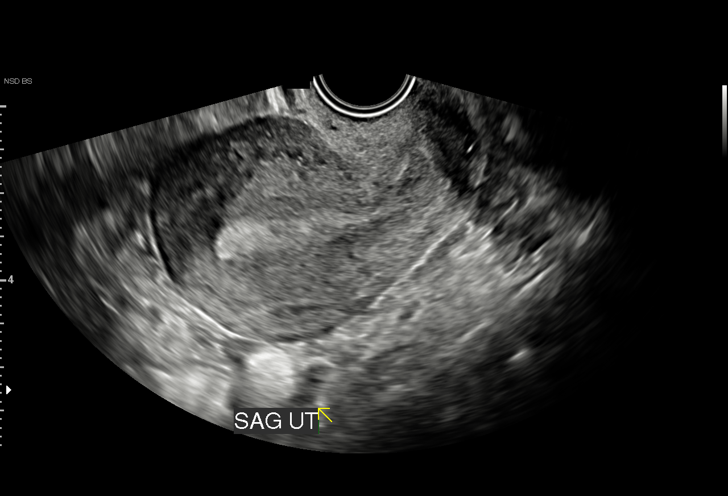

[15 of 25 positions shown; findings below may reference images not displayed]

FINDINGS: Intrauterine gestational sac: None

Yolk sac:  Not Visualized.

Embryo:  Not Visualized.

Subchorionic hemorrhage:  None visualized.

Maternal uterus/adnexae: Ovaries are within normal limits. Left
ovary measures 1.5 x 2.7 x 1.7 cm. Right ovary measures 2.7 x 3 x
2.1 cm and contains corpus luteum. No significant free fluid.
Endometrial thickness of 8 mm.
IMPRESSION: No IUP identified. Previously noted gestational sac and fetal pole
are no longer visualized consistent with interval miscarriage.

## 2022-10-21 ENCOUNTER — Encounter: Payer: Self-pay | Admitting: *Deleted

## 2022-10-22 ENCOUNTER — Ambulatory Visit: Payer: Medicaid Other | Attending: Obstetrics

## 2022-10-22 ENCOUNTER — Encounter: Payer: Self-pay | Admitting: *Deleted

## 2022-10-22 ENCOUNTER — Ambulatory Visit: Payer: Medicaid Other | Admitting: *Deleted

## 2022-10-22 DIAGNOSIS — R638 Other symptoms and signs concerning food and fluid intake: Secondary | ICD-10-CM | POA: Diagnosis present

## 2022-10-22 DIAGNOSIS — O99212 Obesity complicating pregnancy, second trimester: Secondary | ICD-10-CM | POA: Insufficient documentation

## 2022-10-22 DIAGNOSIS — E669 Obesity, unspecified: Secondary | ICD-10-CM | POA: Diagnosis not present

## 2022-10-22 DIAGNOSIS — O09292 Supervision of pregnancy with other poor reproductive or obstetric history, second trimester: Secondary | ICD-10-CM | POA: Diagnosis not present

## 2022-10-22 DIAGNOSIS — Z3A25 25 weeks gestation of pregnancy: Secondary | ICD-10-CM | POA: Diagnosis not present

## 2022-10-25 ENCOUNTER — Other Ambulatory Visit: Payer: Self-pay

## 2022-10-25 DIAGNOSIS — Z8759 Personal history of other complications of pregnancy, childbirth and the puerperium: Secondary | ICD-10-CM

## 2022-10-25 DIAGNOSIS — O99212 Obesity complicating pregnancy, second trimester: Secondary | ICD-10-CM

## 2022-12-03 ENCOUNTER — Ambulatory Visit: Payer: Medicaid Other | Admitting: *Deleted

## 2022-12-03 ENCOUNTER — Other Ambulatory Visit: Payer: Self-pay | Admitting: *Deleted

## 2022-12-03 ENCOUNTER — Ambulatory Visit: Payer: Medicaid Other | Attending: Obstetrics and Gynecology

## 2022-12-03 VITALS — BP 126/71 | HR 101

## 2022-12-03 DIAGNOSIS — O24410 Gestational diabetes mellitus in pregnancy, diet controlled: Secondary | ICD-10-CM

## 2022-12-03 DIAGNOSIS — O99213 Obesity complicating pregnancy, third trimester: Secondary | ICD-10-CM

## 2022-12-03 DIAGNOSIS — Z8759 Personal history of other complications of pregnancy, childbirth and the puerperium: Secondary | ICD-10-CM | POA: Insufficient documentation

## 2022-12-03 DIAGNOSIS — O99212 Obesity complicating pregnancy, second trimester: Secondary | ICD-10-CM | POA: Diagnosis present

## 2022-12-03 DIAGNOSIS — E669 Obesity, unspecified: Secondary | ICD-10-CM | POA: Diagnosis not present

## 2022-12-03 DIAGNOSIS — O24419 Gestational diabetes mellitus in pregnancy, unspecified control: Secondary | ICD-10-CM | POA: Diagnosis present

## 2022-12-03 DIAGNOSIS — Z3A31 31 weeks gestation of pregnancy: Secondary | ICD-10-CM

## 2022-12-03 DIAGNOSIS — O09293 Supervision of pregnancy with other poor reproductive or obstetric history, third trimester: Secondary | ICD-10-CM | POA: Diagnosis not present

## 2022-12-08 ENCOUNTER — Ambulatory Visit: Payer: Medicaid Other | Admitting: Registered"

## 2022-12-13 ENCOUNTER — Inpatient Hospital Stay (HOSPITAL_COMMUNITY)
Admission: AD | Admit: 2022-12-13 | Discharge: 2022-12-13 | Disposition: A | Discharge status: Home / Self Care | Payer: Medicaid Other | Attending: Obstetrics | Admitting: Obstetrics

## 2022-12-13 ENCOUNTER — Encounter (HOSPITAL_COMMUNITY): Payer: Self-pay | Admitting: Obstetrics

## 2022-12-13 DIAGNOSIS — R002 Palpitations: Secondary | ICD-10-CM | POA: Insufficient documentation

## 2022-12-13 DIAGNOSIS — O99353 Diseases of the nervous system complicating pregnancy, third trimester: Secondary | ICD-10-CM | POA: Insufficient documentation

## 2022-12-13 DIAGNOSIS — G4489 Other headache syndrome: Secondary | ICD-10-CM | POA: Insufficient documentation

## 2022-12-13 DIAGNOSIS — Z3A33 33 weeks gestation of pregnancy: Secondary | ICD-10-CM | POA: Diagnosis not present

## 2022-12-13 DIAGNOSIS — O09293 Supervision of pregnancy with other poor reproductive or obstetric history, third trimester: Secondary | ICD-10-CM | POA: Insufficient documentation

## 2022-12-13 DIAGNOSIS — O26893 Other specified pregnancy related conditions, third trimester: Secondary | ICD-10-CM | POA: Diagnosis present

## 2022-12-13 LAB — COMPREHENSIVE METABOLIC PANEL
ALT: 18 U/L (ref 0–44)
AST: 16 U/L (ref 15–41)
Albumin: 2.7 g/dL — ABNORMAL LOW (ref 3.5–5.0)
Alkaline Phosphatase: 90 U/L (ref 38–126)
Anion gap: 11 (ref 5–15)
BUN: 7 mg/dL (ref 6–20)
CO2: 21 mmol/L — ABNORMAL LOW (ref 22–32)
Calcium: 8.8 mg/dL — ABNORMAL LOW (ref 8.9–10.3)
Chloride: 103 mmol/L (ref 98–111)
Creatinine, Ser: 0.6 mg/dL (ref 0.44–1.00)
GFR, Estimated: >60 mL/min (ref >60)
Glucose, Bld: 71 mg/dL (ref 70–99)
Potassium: 3.9 mmol/L (ref 3.5–5.1)
Sodium: 135 mmol/L (ref 135–145)
Total Bilirubin: 0.5 mg/dL (ref 0.3–1.2)
Total Protein: 7 g/dL (ref 6.5–8.1)

## 2022-12-13 LAB — URINALYSIS, ROUTINE W REFLEX MICROSCOPIC
Bilirubin Urine: NEGATIVE
Glucose, UA: 150 mg/dL — AB
Hgb urine dipstick: NEGATIVE
Ketones, ur: NEGATIVE mg/dL
Leukocytes,Ua: NEGATIVE
Nitrite: NEGATIVE
Protein, ur: NEGATIVE mg/dL
Specific Gravity, Urine: 1.019 (ref 1.005–1.030)
pH: 6 (ref 5.0–8.0)

## 2022-12-13 LAB — CBC
HCT: 37.4 % (ref 36.0–46.0)
Hemoglobin: 12.1 g/dL (ref 12.0–15.0)
MCH: 27.2 pg (ref 26.0–34.0)
MCHC: 32.4 g/dL (ref 30.0–36.0)
MCV: 84 fL (ref 80.0–100.0)
Platelets: 288 10*3/uL (ref 150–400)
RBC: 4.45 MIL/uL (ref 3.87–5.11)
RDW: 13.5 % (ref 11.5–15.5)
WBC: 8.4 10*3/uL (ref 4.0–10.5)
nRBC: 0 % (ref 0.0–0.2)

## 2022-12-13 LAB — TSH: TSH: 1.133 u[IU]/mL (ref 0.350–4.500)

## 2022-12-13 LAB — MAGNESIUM: Magnesium: 1.9 mg/dL (ref 1.7–2.4)

## 2022-12-13 MED ORDER — CYCLOBENZAPRINE HCL 5 MG PO TABS
10.0000 mg | ORAL_TABLET | Freq: Once | ORAL | Status: AC
  Administered 2022-12-13: 10 mg via ORAL
  Filled 2022-12-13: qty 2

## 2022-12-13 NOTE — MAU Note (Signed)
.  Jessica Christian is a 22 y.o. at [redacted]w[redacted]d here in MAU reporting: C/O headache  and chest pain  off and on for the past week. Headache feels like it is pounding. Has not taken any pain medication for it.  Chest pain is like pressure and sometimes feels like. it skips a beat.

## 2022-12-13 NOTE — MAU Provider Note (Signed)
History     CSN: [id number]  Arrival date and time: 12/13/22 1550    Chief Complaint  Patient presents with   Headache   Chest Pain   HPI This is a 22 year old G3 P1-0-1-1 at 33 weeks and 2 days who presents with a headache for the past 7 days.  She has not taken anything for her headache.  Her headache is not better with eating or drinking water.  She did not get headaches prior to pregnancy.  She does have a history of gestational hypertension.  Additionally, she presents with chest pressure with a feeling that her heart skips a beat when she lies down.  The pressure gets better when she sits up.  OB History     Gravida  3   Para  1   Term  1   Preterm      AB  1   Living  1      SAB  1   IAB      Ectopic      Multiple  0   Live Births  1           Past Medical History:  Diagnosis Date   Iron deficiency 02/03/2018   Pregnancy induced hypertension    Vitamin D insufficiency 02/03/2018    Past Surgical History:  Procedure Laterality Date   WISDOM TOOTH EXTRACTION      Family History  Problem Relation Age of Onset   Healthy Mother    Healthy Father    Hypertension Maternal Grandmother     Social History   Tobacco Use   Smoking status: Never   Smokeless tobacco: Never  Vaping Use   Vaping Use: Never used  Substance Use Topics   Alcohol use: No   Drug use: Never    Allergies: No Known Allergies  Medications Prior to Admission  Medication Sig Dispense Refill Last Dose   Prenatal Vit-Fe Fumarate-FA (MULTIVITAMIN-PRENATAL) 27-0.8 MG TABS tablet Take 1 tablet by mouth daily at 12 noon.   12/12/2022   promethazine-dextromethorphan (PROMETHAZINE-DM) 6.25-15 MG/5ML syrup Take 5 mLs by mouth every 4 (four) hours. (Patient not taking: Reported on 09/17/2022)       Review of Systems Physical Exam   Blood pressure 113/67, pulse (!) 114, temperature 98.2 F (36.8 C), temperature source Oral, resp. rate 20, last menstrual period 04/19/2022,  SpO2 100 %, currently breastfeeding.  Physical Exam Vitals and nursing note reviewed.  Constitutional:      Appearance: She is well-developed.  Cardiovascular:     Rate and Rhythm: Normal rate and regular rhythm.  Pulmonary:     Effort: Pulmonary effort is normal.     Breath sounds: Normal breath sounds.  Abdominal:     Palpations: Abdomen is soft.  Musculoskeletal:        General: Normal range of motion.  Neurological:     Mental Status: She is alert.  Psychiatric:        Mood and Affect: Mood normal.        Behavior: Behavior normal.   EKG reviewed - sinus tachycardia with normal intervals. No PVCs seen.  Results for orders placed or performed during the hospital encounter of 12/13/22 (from the past 24 hour(s))  Urinalysis, Routine w reflex microscopic -Urine, Clean Catch     Status: Abnormal   Collection Time: 12/13/22  4:00 PM  Result Value Ref Range   Color, Urine YELLOW YELLOW   APPearance CLEAR CLEAR   Specific Gravity, Urine 1.019 1.005 -  1.030   pH 6.0 5.0 - 8.0   Glucose, UA 150 (A) NEGATIVE mg/dL   Hgb urine dipstick NEGATIVE NEGATIVE   Bilirubin Urine NEGATIVE NEGATIVE   Ketones, ur NEGATIVE NEGATIVE mg/dL   Protein, ur NEGATIVE NEGATIVE mg/dL   Nitrite NEGATIVE NEGATIVE   Leukocytes,Ua NEGATIVE NEGATIVE  Comprehensive metabolic panel     Status: Abnormal   Collection Time: 12/13/22  5:33 PM  Result Value Ref Range   Sodium 135 135 - 145 mmol/L   Potassium 3.9 3.5 - 5.1 mmol/L   Chloride 103 98 - 111 mmol/L   CO2 21 (L) 22 - 32 mmol/L   Glucose, Bld 71 70 - 99 mg/dL   BUN 7 6 - 20 mg/dL   Creatinine, Ser 1.61 0.44 - 1.00 mg/dL   Calcium 8.8 (L) 8.9 - 10.3 mg/dL   Total Protein 7.0 6.5 - 8.1 g/dL   Albumin 2.7 (L) 3.5 - 5.0 g/dL   AST 16 15 - 41 U/L   ALT 18 0 - 44 U/L   Alkaline Phosphatase 90 38 - 126 U/L   Total Bilirubin 0.5 0.3 - 1.2 mg/dL   GFR, Estimated >09 >60 mL/min   Anion gap 11 5 - 15  CBC     Status: None   Collection Time: 12/13/22   5:33 PM  Result Value Ref Range   WBC 8.4 4.0 - 10.5 K/uL   RBC 4.45 3.87 - 5.11 MIL/uL   Hemoglobin 12.1 12.0 - 15.0 g/dL   HCT 45.4 09.8 - 11.9 %   MCV 84.0 80.0 - 100.0 fL   MCH 27.2 26.0 - 34.0 pg   MCHC 32.4 30.0 - 36.0 g/dL   RDW 14.7 82.9 - 56.2 %   Platelets 288 150 - 400 K/uL   nRBC 0.0 0.0 - 0.2 %  Magnesium     Status: None   Collection Time: 12/13/22  5:33 PM  Result Value Ref Range   Magnesium 1.9 1.7 - 2.4 mg/dL  TSH     Status: None   Collection Time: 12/13/22  5:33 PM  Result Value Ref Range   TSH 1.133 0.350 - 4.500 uIU/mL     MAU Course  Procedures NST:  Baseline: 140  Variability: moderate Accelerations: present  Decelerations: none Contractions: none   MDM Check CBC, CMP, TSH, magnesium  Headache improved briefly with flexeril 10mg , but now returned.  Assessment and Plan   1. [redacted] weeks gestation of pregnancy   2. Other headache syndrome   3. Palpitations    Patient declined prescription for flexeril Declined other medication. BPs normal Recommended vit B12, magnesium for headaches. Referral to [facility] cards for palpitations  Levie Heritage 12/13/2022, 4:54 PM

## 2022-12-13 NOTE — Discharge Instructions (Signed)
You can take Magnesium 400-600mg  twice a day and Vit B12 448mcg-600mcg twice a day for headaches

## 2022-12-17 ENCOUNTER — Ambulatory Visit: Payer: Medicaid Other

## 2022-12-17 ENCOUNTER — Ambulatory Visit: Payer: Medicaid Other | Attending: Obstetrics and Gynecology

## 2022-12-22 ENCOUNTER — Ambulatory Visit: Payer: Medicaid Other

## 2022-12-23 ENCOUNTER — Ambulatory Visit: Payer: Medicaid Other | Admitting: *Deleted

## 2022-12-23 ENCOUNTER — Ambulatory Visit: Payer: Medicaid Other | Attending: Obstetrics and Gynecology

## 2022-12-23 VITALS — BP 130/82 | HR 113

## 2022-12-23 DIAGNOSIS — O24420 Gestational diabetes mellitus in childbirth, diet controlled: Secondary | ICD-10-CM | POA: Insufficient documentation

## 2022-12-23 DIAGNOSIS — E669 Obesity, unspecified: Secondary | ICD-10-CM

## 2022-12-23 DIAGNOSIS — Z3A34 34 weeks gestation of pregnancy: Secondary | ICD-10-CM

## 2022-12-23 DIAGNOSIS — O09293 Supervision of pregnancy with other poor reproductive or obstetric history, third trimester: Secondary | ICD-10-CM | POA: Diagnosis not present

## 2022-12-23 DIAGNOSIS — Z8759 Personal history of other complications of pregnancy, childbirth and the puerperium: Secondary | ICD-10-CM | POA: Diagnosis present

## 2022-12-23 DIAGNOSIS — O99213 Obesity complicating pregnancy, third trimester: Secondary | ICD-10-CM | POA: Diagnosis not present

## 2022-12-23 DIAGNOSIS — O133 Gestational [pregnancy-induced] hypertension without significant proteinuria, third trimester: Secondary | ICD-10-CM | POA: Diagnosis present

## 2022-12-23 DIAGNOSIS — O24410 Gestational diabetes mellitus in pregnancy, diet controlled: Secondary | ICD-10-CM | POA: Diagnosis not present

## 2022-12-23 DIAGNOSIS — O99212 Obesity complicating pregnancy, second trimester: Secondary | ICD-10-CM | POA: Diagnosis not present

## 2022-12-31 ENCOUNTER — Ambulatory Visit: Payer: Medicaid Other | Admitting: *Deleted

## 2022-12-31 ENCOUNTER — Ambulatory Visit: Payer: Medicaid Other

## 2022-12-31 VITALS — BP 127/86 | HR 103

## 2022-12-31 DIAGNOSIS — O09293 Supervision of pregnancy with other poor reproductive or obstetric history, third trimester: Secondary | ICD-10-CM | POA: Diagnosis not present

## 2022-12-31 DIAGNOSIS — O99213 Obesity complicating pregnancy, third trimester: Secondary | ICD-10-CM | POA: Insufficient documentation

## 2022-12-31 DIAGNOSIS — Z3A35 35 weeks gestation of pregnancy: Secondary | ICD-10-CM

## 2022-12-31 DIAGNOSIS — O24410 Gestational diabetes mellitus in pregnancy, diet controlled: Secondary | ICD-10-CM | POA: Diagnosis not present

## 2022-12-31 DIAGNOSIS — E669 Obesity, unspecified: Secondary | ICD-10-CM

## 2023-01-04 LAB — OB RESULTS CONSOLE GBS: GBS: POSITIVE

## 2023-01-07 ENCOUNTER — Ambulatory Visit: Payer: Medicaid Other | Admitting: *Deleted

## 2023-01-07 ENCOUNTER — Inpatient Hospital Stay (HOSPITAL_COMMUNITY)
Admission: RE | Admit: 2023-01-07 | Payer: Medicaid Other | Source: Home / Self Care | Admitting: Obstetrics and Gynecology

## 2023-01-07 ENCOUNTER — Other Ambulatory Visit: Payer: Self-pay

## 2023-01-07 ENCOUNTER — Inpatient Hospital Stay (HOSPITAL_COMMUNITY)
Admission: AD | Admit: 2023-01-07 | Discharge: 2023-01-07 | Disposition: A | Discharge status: Home / Self Care | Payer: Medicaid Other | Attending: Obstetrics & Gynecology | Admitting: Obstetrics & Gynecology

## 2023-01-07 ENCOUNTER — Encounter (HOSPITAL_COMMUNITY): Payer: Self-pay | Admitting: Obstetrics & Gynecology

## 2023-01-07 ENCOUNTER — Other Ambulatory Visit: Payer: Self-pay | Admitting: Obstetrics and Gynecology

## 2023-01-07 ENCOUNTER — Inpatient Hospital Stay (HOSPITAL_BASED_OUTPATIENT_CLINIC_OR_DEPARTMENT_OTHER): Payer: Medicaid Other

## 2023-01-07 ENCOUNTER — Ambulatory Visit: Payer: Medicaid Other | Attending: Obstetrics and Gynecology

## 2023-01-07 ENCOUNTER — Other Ambulatory Visit: Payer: Self-pay | Admitting: *Deleted

## 2023-01-07 VITALS — BP 126/68 | HR 99

## 2023-01-07 DIAGNOSIS — O24415 Gestational diabetes mellitus in pregnancy, controlled by oral hypoglycemic drugs: Secondary | ICD-10-CM

## 2023-01-07 DIAGNOSIS — Z3A36 36 weeks gestation of pregnancy: Secondary | ICD-10-CM

## 2023-01-07 DIAGNOSIS — O10013 Pre-existing essential hypertension complicating pregnancy, third trimester: Secondary | ICD-10-CM | POA: Diagnosis not present

## 2023-01-07 DIAGNOSIS — O99213 Obesity complicating pregnancy, third trimester: Secondary | ICD-10-CM

## 2023-01-07 DIAGNOSIS — O09293 Supervision of pregnancy with other poor reproductive or obstetric history, third trimester: Secondary | ICD-10-CM

## 2023-01-07 DIAGNOSIS — O368330 Maternal care for abnormalities of the fetal heart rate or rhythm, third trimester, not applicable or unspecified: Secondary | ICD-10-CM | POA: Diagnosis present

## 2023-01-07 DIAGNOSIS — E669 Obesity, unspecified: Secondary | ICD-10-CM | POA: Diagnosis not present

## 2023-01-07 DIAGNOSIS — O283 Abnormal ultrasonic finding on antenatal screening of mother: Secondary | ICD-10-CM | POA: Diagnosis present

## 2023-01-07 DIAGNOSIS — O24420 Gestational diabetes mellitus in childbirth, diet controlled: Secondary | ICD-10-CM | POA: Insufficient documentation

## 2023-01-07 DIAGNOSIS — Z8759 Personal history of other complications of pregnancy, childbirth and the puerperium: Secondary | ICD-10-CM | POA: Diagnosis present

## 2023-01-07 DIAGNOSIS — O24414 Gestational diabetes mellitus in pregnancy, insulin controlled: Secondary | ICD-10-CM

## 2023-01-07 DIAGNOSIS — O368390 Maternal care for abnormalities of the fetal heart rate or rhythm, unspecified trimester, not applicable or unspecified: Secondary | ICD-10-CM

## 2023-01-07 LAB — URINALYSIS, ROUTINE W REFLEX MICROSCOPIC
Bilirubin Urine: NEGATIVE
Glucose, UA: NEGATIVE mg/dL
Hgb urine dipstick: NEGATIVE
Ketones, ur: NEGATIVE mg/dL
Leukocytes,Ua: NEGATIVE
Nitrite: NEGATIVE
Protein, ur: NEGATIVE mg/dL
Specific Gravity, Urine: 1.02 (ref 1.005–1.030)
pH: 6 (ref 5.0–8.0)

## 2023-01-07 NOTE — MAU Note (Signed)
Jessica Christian is a 22 y.o. at [redacted]w[redacted]d here in MAU reporting: she was sent to MAU for monitoring due to "baby failed BPP."   Denies VB or LOF.  Endorses +FM. LMP: NA Onset of complaint: today Pain score: 0 Vitals:   01/07/23 1359  BP: (!) 144/92  Pulse: (!) 107  Resp: 18  Temp: 98.1 F (36.7 C)  SpO2: 100%     FHT:152 bpm Lab orders placed from triage:   None

## 2023-01-07 NOTE — Procedures (Signed)
Jessica Christian 2000-09-03 [redacted]w[redacted]d  Fetus A Non-Stress Test Interpretation for 01/07/23  Indication: Unsatisfactory BPP    BPP with NST  Fetal Heart Rate A Mode: External Baseline Rate (A): 145 bpm Variability: Minimal, Moderate Accelerations: 15 x 15 Decelerations: Variable Multiple birth?: No  Uterine Activity Mode: Palpation, Toco Contraction Frequency (min): none Resting Tone Palpated: Relaxed  Interpretation (Fetal Testing) Nonstress Test Interpretation: Reactive Comments: Dr. Darra Lis reviewed tracing. Patient sent to MAU for prolonged fetal monitoring.

## 2023-01-07 NOTE — Discharge Instructions (Signed)
Please return with any vaginal bleeding, loss of fluid, regular contractions, or not feeling your baby move.

## 2023-01-07 NOTE — MAU Provider Note (Cosign Needed Addendum)
**Note De-Identified Jessica Obfuscation** History    Chief Complaint  Patient presents with   Fetal Monitoring   Jessica Christian is a 21yo at [redacted]w[redacted]d who presents to the MAU for follow-up after a failed routine BPP on 01/07/23 (score: 6/10, did not pass fetal breathing, equivocal NST). Besides feeling slightly anxious, she feels well and has no acute complaints. She notes no changes to fetal movement. Pertinent negatives include no fever, chills, pain, or shortness of breath. Patient ate lunch immediately before arrival.    OB History     Gravida  3   Jessica  1   Term  1   Preterm      AB  1   Living  1      SAB  1   IAB      Ectopic      Multiple  0   Live Births  1           Past Medical History:  Diagnosis Date   Iron deficiency 02/03/2018   Pregnancy induced hypertension    Vitamin D insufficiency 02/03/2018    Past Surgical History:  Procedure Laterality Date   WISDOM TOOTH EXTRACTION      Family History  Problem Relation Age of Onset   Healthy Mother    Healthy Father    Hypertension Maternal Grandmother     Social History   Tobacco Use   Smoking status: Never   Smokeless tobacco: Never  Vaping Use   Vaping status: Never Used  Substance Use Topics   Alcohol use: No   Drug use: Never    Allergies: No Known Allergies  Medications Prior to Admission  Medication Sig Dispense Refill Last Dose   Prenatal Vit-Fe Fumarate-FA (MULTIVITAMIN-PRENATAL) 27-0.8 MG TABS tablet Take 1 tablet by mouth daily at 12 noon.   01/06/2023   promethazine-dextromethorphan (PROMETHAZINE-DM) 6.25-15 MG/5ML syrup Take 5 mLs by mouth every 4 (four) hours. (Patient not taking: Reported on 09/17/2022)       Review of Systems  Constitutional:  Negative for activity change, appetite change, chills, fatigue and fever.  Respiratory:  Negative for cough and shortness of breath.   Cardiovascular:  Negative for chest pain and palpitations.   Physical Exam Blood pressure 124/79, pulse (!) 129, temperature 98.1 F  (36.7 C), temperature source Oral, resp. rate 19, height 5\' 3"  (1.6 m), weight 122.8 kg, last menstrual period 04/19/2022, SpO2 100%, currently breastfeeding. Physical Exam Constitutional:      General: She is not in acute distress.    Appearance: She is not ill-appearing.  HENT:     Mouth/Throat:     Mouth: Mucous membranes are moist.     Pharynx: Oropharynx is clear.  Cardiovascular:     Rate and Rhythm: Regular rhythm. Tachycardia present.  Pulmonary:     Effort: Pulmonary effort is normal.     Breath sounds: Normal breath sounds.  Skin:    General: Skin is warm and dry.  Neurological:     Mental Status: She is alert.    Assessment/Plan:  Jessica Christian is a well-appearing 21yo at [redacted]w[redacted]d gestation who presents following a BPP test failed due to lack of fetal breathing and equivocal NST. Fetal rhythm strip in the MAU is reassuring with 150bpm baseline, moderate variability, 1 15x15 acceleration, and no decelerations over first 20 minutes. There is low concern for poor fetal biophysical profile.  Repeat BPP 8/8. FHT has looked good while in MAU - 150/moderate/15x15/no decels. Will discharge at this time. Advised to return with  vaginal bleeding, loss of fluid, regular contractions or decreased fetal movement. Pt understands and is comfortable with POC.    Attestation of Supervision of Student:  I confirm that I have verified the information documented in the medical student's note and that I have also personally performed the history, physical exam and all medical decision making activities.  I have verified that all services and findings are accurately documented in this student's note; and I agree with management and plan as outlined in the documentation. I have also made any necessary editorial changes.  BPP 8/8. Okay for discharge. Return precautions given.   Jessica March, DO [facility] [employer] 01/07/2023 3:59 PM   CNM attestation:  I  have seen and examined this patient; I agree with above documentation in the resident's note.   Jessica Christian is a 22 y.o. [id number] sent from MFM for BPP 6/8 and reassuring but not reactive NST. +FM, denies LOF, VB, contractions, vaginal discharge.  PE: BP 137/78 (BP Location: Left Arm)   Pulse (!) 111   Temp 98 F (36.7 C) (Oral)   Resp 19   Ht 5\' 3"  (1.6 m)   Wt 122.8 kg   LMP 04/19/2022   SpO2 100%   BMI 47.95 kg/m  Gen: calm comfortable, NAD Resp: normal effort, no distress Abd: gravid  ROS, labs, PMH reviewed NST reactive  BPP 8/8 in MAU, 4 hours after BPP in MFM today  Plan: D/C home Fetal kick counts reinforced, Preterm labor precautions Keep scheduled [place] with MFM Continue routine follow up in OB office Return to MAU as needed for emergencies  Jessica Christian, CNM 9:03 PM

## 2023-01-13 ENCOUNTER — Ambulatory Visit: Payer: Medicaid Other

## 2023-01-14 ENCOUNTER — Ambulatory Visit (HOSPITAL_BASED_OUTPATIENT_CLINIC_OR_DEPARTMENT_OTHER): Payer: Medicaid Other

## 2023-01-14 ENCOUNTER — Other Ambulatory Visit: Payer: Self-pay | Admitting: Obstetrics and Gynecology

## 2023-01-14 ENCOUNTER — Ambulatory Visit: Payer: Medicaid Other | Attending: Maternal & Fetal Medicine | Admitting: *Deleted

## 2023-01-14 VITALS — BP 124/83 | HR 117

## 2023-01-14 DIAGNOSIS — O99213 Obesity complicating pregnancy, third trimester: Secondary | ICD-10-CM | POA: Insufficient documentation

## 2023-01-14 DIAGNOSIS — Z3A37 37 weeks gestation of pregnancy: Secondary | ICD-10-CM

## 2023-01-14 DIAGNOSIS — O24414 Gestational diabetes mellitus in pregnancy, insulin controlled: Secondary | ICD-10-CM

## 2023-01-14 DIAGNOSIS — O24415 Gestational diabetes mellitus in pregnancy, controlled by oral hypoglycemic drugs: Secondary | ICD-10-CM

## 2023-01-14 DIAGNOSIS — E669 Obesity, unspecified: Secondary | ICD-10-CM | POA: Diagnosis not present

## 2023-01-14 DIAGNOSIS — O09293 Supervision of pregnancy with other poor reproductive or obstetric history, third trimester: Secondary | ICD-10-CM

## 2023-01-14 DIAGNOSIS — O24419 Gestational diabetes mellitus in pregnancy, unspecified control: Secondary | ICD-10-CM

## 2023-01-19 ENCOUNTER — Telehealth (HOSPITAL_COMMUNITY): Payer: Self-pay | Admitting: *Deleted

## 2023-01-19 NOTE — Telephone Encounter (Signed)
Preadmission screen  

## 2023-01-20 ENCOUNTER — Encounter (HOSPITAL_COMMUNITY): Payer: Self-pay | Admitting: *Deleted

## 2023-01-21 ENCOUNTER — Ambulatory Visit: Payer: Medicaid Other | Attending: Maternal & Fetal Medicine

## 2023-01-21 ENCOUNTER — Ambulatory Visit: Payer: Medicaid Other | Admitting: *Deleted

## 2023-01-21 VITALS — BP 122/80 | HR 91

## 2023-01-21 DIAGNOSIS — O24415 Gestational diabetes mellitus in pregnancy, controlled by oral hypoglycemic drugs: Secondary | ICD-10-CM | POA: Diagnosis not present

## 2023-01-21 DIAGNOSIS — E669 Obesity, unspecified: Secondary | ICD-10-CM

## 2023-01-21 DIAGNOSIS — O24414 Gestational diabetes mellitus in pregnancy, insulin controlled: Secondary | ICD-10-CM | POA: Diagnosis not present

## 2023-01-21 DIAGNOSIS — O99213 Obesity complicating pregnancy, third trimester: Secondary | ICD-10-CM | POA: Diagnosis not present

## 2023-01-21 DIAGNOSIS — O24419 Gestational diabetes mellitus in pregnancy, unspecified control: Secondary | ICD-10-CM | POA: Insufficient documentation

## 2023-01-21 DIAGNOSIS — O09293 Supervision of pregnancy with other poor reproductive or obstetric history, third trimester: Secondary | ICD-10-CM | POA: Diagnosis not present

## 2023-01-21 DIAGNOSIS — Z3A38 38 weeks gestation of pregnancy: Secondary | ICD-10-CM

## 2023-01-24 ENCOUNTER — Other Ambulatory Visit: Payer: Self-pay

## 2023-01-24 ENCOUNTER — Encounter (HOSPITAL_COMMUNITY): Payer: Self-pay | Admitting: Obstetrics and Gynecology

## 2023-01-24 ENCOUNTER — Inpatient Hospital Stay (HOSPITAL_COMMUNITY): Payer: Medicaid Other | Admitting: Anesthesiology

## 2023-01-24 ENCOUNTER — Inpatient Hospital Stay (HOSPITAL_COMMUNITY)
Admission: AD | Admit: 2023-01-24 | Discharge: 2023-01-26 | DRG: 807 | Disposition: A | Discharge status: Home / Self Care | Payer: Medicaid Other | Attending: Obstetrics and Gynecology | Admitting: Obstetrics and Gynecology

## 2023-01-24 DIAGNOSIS — O26893 Other specified pregnancy related conditions, third trimester: Secondary | ICD-10-CM | POA: Diagnosis present

## 2023-01-24 DIAGNOSIS — O99214 Obesity complicating childbirth: Secondary | ICD-10-CM | POA: Diagnosis present

## 2023-01-24 DIAGNOSIS — O24425 Gestational diabetes mellitus in childbirth, controlled by oral hypoglycemic drugs: Principal | ICD-10-CM | POA: Diagnosis present

## 2023-01-24 DIAGNOSIS — Z3A39 39 weeks gestation of pregnancy: Secondary | ICD-10-CM

## 2023-01-24 DIAGNOSIS — O99824 Streptococcus B carrier state complicating childbirth: Secondary | ICD-10-CM | POA: Diagnosis present

## 2023-01-24 DIAGNOSIS — O288 Other abnormal findings on antenatal screening of mother: Secondary | ICD-10-CM

## 2023-01-24 DIAGNOSIS — R030 Elevated blood-pressure reading, without diagnosis of hypertension: Secondary | ICD-10-CM | POA: Diagnosis present

## 2023-01-24 DIAGNOSIS — O24415 Gestational diabetes mellitus in pregnancy, controlled by oral hypoglycemic drugs: Principal | ICD-10-CM | POA: Diagnosis present

## 2023-01-24 LAB — URINALYSIS, ROUTINE W REFLEX MICROSCOPIC
Bilirubin Urine: NEGATIVE
Glucose, UA: NEGATIVE mg/dL
Hgb urine dipstick: NEGATIVE
Ketones, ur: NEGATIVE mg/dL
Leukocytes,Ua: NEGATIVE
Nitrite: NEGATIVE
Protein, ur: NEGATIVE mg/dL
Specific Gravity, Urine: 1.021 (ref 1.005–1.030)
pH: 6 (ref 5.0–8.0)

## 2023-01-24 LAB — TYPE AND SCREEN
ABO/RH(D): B POS
Antibody Screen: NEGATIVE

## 2023-01-24 LAB — CBC
HCT: 37.1 % (ref 36.0–46.0)
Hemoglobin: 11.7 g/dL — ABNORMAL LOW (ref 12.0–15.0)
MCH: 25.5 pg — ABNORMAL LOW (ref 26.0–34.0)
MCHC: 31.5 g/dL (ref 30.0–36.0)
MCV: 80.8 fL (ref 80.0–100.0)
Platelets: 299 10*3/uL (ref 150–400)
RBC: 4.59 MIL/uL (ref 3.87–5.11)
RDW: 14.5 % (ref 11.5–15.5)
WBC: 6.3 10*3/uL (ref 4.0–10.5)
nRBC: 0 % (ref 0.0–0.2)

## 2023-01-24 LAB — COMPREHENSIVE METABOLIC PANEL
ALT: 19 U/L (ref 0–44)
AST: 17 U/L (ref 15–41)
Albumin: 2.5 g/dL — ABNORMAL LOW (ref 3.5–5.0)
Alkaline Phosphatase: 124 U/L (ref 38–126)
Anion gap: 11 (ref 5–15)
BUN: 8 mg/dL (ref 6–20)
CO2: 21 mmol/L — ABNORMAL LOW (ref 22–32)
Calcium: 8.6 mg/dL — ABNORMAL LOW (ref 8.9–10.3)
Chloride: 105 mmol/L (ref 98–111)
Creatinine, Ser: 0.82 mg/dL (ref 0.44–1.00)
GFR, Estimated: >60 mL/min (ref >60)
Glucose, Bld: 86 mg/dL (ref 70–99)
Potassium: 3.8 mmol/L (ref 3.5–5.1)
Sodium: 137 mmol/L (ref 135–145)
Total Bilirubin: 0.4 mg/dL (ref 0.3–1.2)
Total Protein: 6.8 g/dL (ref 6.5–8.1)

## 2023-01-24 LAB — PROTEIN / CREATININE RATIO, URINE
Creatinine, Urine: 182 mg/dL
Protein Creatinine Ratio: 0.15 mg/mg{Cre} (ref 0.00–0.15)
Total Protein, Urine: 28 mg/dL

## 2023-01-24 LAB — RPR: RPR Ser Ql: NONREACTIVE

## 2023-01-24 MED ORDER — SOD CITRATE-CITRIC ACID 500-334 MG/5ML PO SOLN: 30.0000 mL | ORAL | Status: DC | PRN

## 2023-01-24 MED ORDER — EPHEDRINE 5 MG/ML INJ: 10.0000 mg | INTRAVENOUS | Status: DC | PRN

## 2023-01-24 MED ORDER — OXYCODONE-ACETAMINOPHEN 5-325 MG PO TABS: 1.0000 | ORAL_TABLET | ORAL | Status: DC | PRN

## 2023-01-24 MED ORDER — WITCH HAZEL-GLYCERIN EX PADS: 1.0000 | MEDICATED_PAD | CUTANEOUS | Status: DC | PRN

## 2023-01-24 MED ORDER — OXYTOCIN-SODIUM CHLORIDE 30-0.9 UT/500ML-% IV SOLN: 2.5000 [IU]/h | INTRAVENOUS | Status: DC

## 2023-01-24 MED ORDER — SENNOSIDES-DOCUSATE SODIUM 8.6-50 MG PO TABS
2.0000 | ORAL_TABLET | Freq: Every day | ORAL | Status: DC
  Administered 2023-01-25 – 2023-01-26 (×2): 2 via ORAL
  Filled 2023-01-24 (×2): qty 2

## 2023-01-24 MED ORDER — LIDOCAINE HCL (PF) 1 % IJ SOLN: 30.0000 mL | INTRAMUSCULAR | Status: DC | PRN

## 2023-01-24 MED ORDER — PRENATAL MULTIVITAMIN CH
1.0000 | ORAL_TABLET | Freq: Every day | ORAL | Status: DC
  Administered 2023-01-25 – 2023-01-26 (×2): 1 via ORAL
  Filled 2023-01-24 (×2): qty 1

## 2023-01-24 MED ORDER — TETANUS-DIPHTH-ACELL PERTUSSIS 5-2.5-18.5 LF-MCG/0.5 IM SUSY: 0.5000 mL | PREFILLED_SYRINGE | Freq: Once | INTRAMUSCULAR | Status: DC

## 2023-01-24 MED ORDER — SODIUM CHLORIDE 0.9 % IV SOLN
5.0000 10*6.[IU] | Freq: Once | INTRAVENOUS | Status: AC
  Administered 2023-01-24: 5 10*6.[IU] via INTRAVENOUS
  Filled 2023-01-24: qty 5

## 2023-01-24 MED ORDER — LACTATED RINGERS IV SOLN: INTRAVENOUS | Status: DC

## 2023-01-24 MED ORDER — FENTANYL CITRATE (PF) 100 MCG/2ML IJ SOLN: 50.0000 ug | INTRAMUSCULAR | Status: DC | PRN

## 2023-01-24 MED ORDER — ACETAMINOPHEN 325 MG PO TABS: 650.0000 mg | ORAL_TABLET | ORAL | Status: DC | PRN

## 2023-01-24 MED ORDER — OXYCODONE-ACETAMINOPHEN 5-325 MG PO TABS: 2.0000 | ORAL_TABLET | ORAL | Status: DC | PRN

## 2023-01-24 MED ORDER — IBUPROFEN 600 MG PO TABS
600.0000 mg | ORAL_TABLET | Freq: Four times a day (QID) | ORAL | Status: DC
  Administered 2023-01-24 – 2023-01-26 (×8): 600 mg via ORAL
  Filled 2023-01-24 (×8): qty 1

## 2023-01-24 MED ORDER — OXYTOCIN BOLUS FROM INFUSION
333.0000 mL | Freq: Once | INTRAVENOUS | Status: AC
  Administered 2023-01-24: 333 mL via INTRAVENOUS

## 2023-01-24 MED ORDER — OXYTOCIN-SODIUM CHLORIDE 30-0.9 UT/500ML-% IV SOLN
1.0000 m[IU]/min | INTRAVENOUS | Status: DC
  Filled 2023-01-24: qty 500

## 2023-01-24 MED ORDER — FENTANYL-BUPIVACAINE-NACL 0.5-0.125-0.9 MG/250ML-% EP SOLN
12.0000 mL/h | EPIDURAL | Status: DC | PRN
  Administered 2023-01-24: 12 mL/h via EPIDURAL
  Filled 2023-01-24: qty 250

## 2023-01-24 MED ORDER — ONDANSETRON HCL 4 MG PO TABS: 4.0000 mg | ORAL_TABLET | ORAL | Status: DC | PRN

## 2023-01-24 MED ORDER — DIPHENHYDRAMINE HCL 50 MG/ML IJ SOLN: 12.5000 mg | INTRAMUSCULAR | Status: DC | PRN

## 2023-01-24 MED ORDER — DIPHENHYDRAMINE HCL 25 MG PO CAPS: 25.0000 mg | ORAL_CAPSULE | Freq: Four times a day (QID) | ORAL | Status: DC | PRN

## 2023-01-24 MED ORDER — TERBUTALINE SULFATE 1 MG/ML IJ SOLN: 0.2500 mg | Freq: Once | INTRAMUSCULAR | Status: DC | PRN

## 2023-01-24 MED ORDER — COCONUT OIL OIL: 1.0000 | TOPICAL_OIL | Status: DC | PRN

## 2023-01-24 MED ORDER — BENZOCAINE-MENTHOL 20-0.5 % EX AERO: 1.0000 | INHALATION_SPRAY | CUTANEOUS | Status: DC | PRN

## 2023-01-24 MED ORDER — PHENYLEPHRINE 80 MCG/ML (10ML) SYRINGE FOR IV PUSH (FOR BLOOD PRESSURE SUPPORT)
80.0000 ug | PREFILLED_SYRINGE | INTRAVENOUS | Status: DC | PRN
  Filled 2023-01-24: qty 10

## 2023-01-24 MED ORDER — ONDANSETRON HCL 4 MG/2ML IJ SOLN: 4.0000 mg | INTRAMUSCULAR | Status: DC | PRN

## 2023-01-24 MED ORDER — PENICILLIN G POT IN DEXTROSE 60000 UNIT/ML IV SOLN: 3.0000 10*6.[IU] | INTRAVENOUS | Status: DC

## 2023-01-24 MED ORDER — ONDANSETRON HCL 4 MG/2ML IJ SOLN: 4.0000 mg | Freq: Four times a day (QID) | INTRAMUSCULAR | Status: DC | PRN

## 2023-01-24 MED ORDER — LIDOCAINE HCL (PF) 1 % IJ SOLN
INTRAMUSCULAR | Status: DC | PRN
  Administered 2023-01-24 (×2): 4 mL via EPIDURAL

## 2023-01-24 MED ORDER — LACTATED RINGERS IV SOLN
500.0000 mL | INTRAVENOUS | Status: DC | PRN
  Administered 2023-01-24: 500 mL via INTRAVENOUS

## 2023-01-24 MED ORDER — ZOLPIDEM TARTRATE 5 MG PO TABS: 5.0000 mg | ORAL_TABLET | Freq: Every evening | ORAL | Status: DC | PRN

## 2023-01-24 MED ORDER — SIMETHICONE 80 MG PO CHEW: 80.0000 mg | CHEWABLE_TABLET | ORAL | Status: DC | PRN

## 2023-01-24 MED ORDER — LACTATED RINGERS IV SOLN: 500.0000 mL | Freq: Once | INTRAVENOUS | Status: DC

## 2023-01-24 MED ORDER — DIBUCAINE (PERIANAL) 1 % EX OINT: 1.0000 | TOPICAL_OINTMENT | CUTANEOUS | Status: DC | PRN

## 2023-01-24 MED ORDER — PHENYLEPHRINE 80 MCG/ML (10ML) SYRINGE FOR IV PUSH (FOR BLOOD PRESSURE SUPPORT)
80.0000 ug | PREFILLED_SYRINGE | INTRAVENOUS | Status: DC | PRN
  Administered 2023-01-24: 80 ug via INTRAVENOUS

## 2023-01-24 NOTE — Anesthesia Procedure Notes (Signed)
Epidural Patient location during procedure: OB Start time: 01/24/2023 12:42 PM End time: 01/24/2023 12:45 PM  Staffing Anesthesiologist: Kaylyn Layer, MD Performed: anesthesiologist   Preanesthetic Checklist Completed: patient identified, IV checked, risks and benefits discussed, monitors and equipment checked, pre-op evaluation and timeout performed  Epidural Patient position: sitting Prep: DuraPrep and site prepped and draped Patient monitoring: continuous pulse ox, blood pressure and heart rate Approach: midline Location: L3-L4 Injection technique: LOR air  Needle:  Needle type: Tuohy  Needle gauge: 17 G Needle length: 9 cm Needle insertion depth: 7 cm Catheter type: closed end flexible Catheter size: 19 Gauge Catheter at skin depth: 12 cm Test dose: negative and Other (1% lidocaine)  Assessment Events: blood not aspirated, no cerebrospinal fluid, injection not painful, no injection resistance, no paresthesia and negative IV test  Additional Notes Patient identified. Risks, benefits, and alternatives discussed with patient including but not limited to bleeding, infection, nerve damage, paralysis, failed block, incomplete pain control, headache, blood pressure changes, nausea, vomiting, reactions to medication, itching, and postpartum back pain. Confirmed with bedside nurse the patient's most recent platelet count. Confirmed with patient that they are not currently taking any anticoagulation, have any bleeding history, or any family history of bleeding disorders. Patient expressed understanding and wished to proceed. All questions were answered. Sterile technique was used throughout the entire procedure. Please see nursing notes for vital signs.   Crisp LOR on first pass. Test dose was given through epidural catheter and negative prior to continuing to dose epidural or start infusion. Warning signs of high block given to the patient including shortness of breath,  tingling/numbness in hands, complete motor block, or any concerning symptoms with instructions to call for help. Patient was given instructions on fall risk and not to get out of bed. All questions and concerns addressed with instructions to call with any issues or inadequate analgesia.  Reason for block:procedure for pain

## 2023-01-24 NOTE — MAU Provider Note (Signed)
History     CSN: [id number]  Arrival date and time: 01/24/23 0753   Event Date/Time   First Provider Initiated Contact with Patient 01/24/23 [phone]      Chief Complaint  Patient presents with   Contractions   Ms. Jessica Christian is a 22 y.o. year old [id number] female at [redacted]w[redacted]d weeks gestation who presents to MAU reporting contractions since midnight that became every 5 minutes around 0600 this morning. She rates them about a 8/10. She reports bloody show with wiping, but now bleeding like a period. She denies LOF. She denies any H/A, dizziness, blurry vision, epigastric pain, RUQ pain, or edema. She reports (+) FM. She has a h/o gHTN with her last pregnancy. She has had some mildly elevated BPs with this pregnancy, but no diagnosis of hypertension. She has GDM and is scheduled for IOL tomorrow morning. She receives [facility] with [facility].     OB History     Gravida  3   Para  1   Term  1   Preterm      AB  1   Living  1      SAB  1   IAB      Ectopic      Multiple  0   Live Births  1           Past Medical History:  Diagnosis Date   Anemia    Iron deficiency 02/03/2018   Pregnancy induced hypertension    Vitamin D insufficiency 02/03/2018    Past Surgical History:  Procedure Laterality Date   WISDOM TOOTH EXTRACTION      Family History  Problem Relation Age of Onset   Healthy Mother    Healthy Father    Hypertension Maternal Grandmother    Asthma Neg Hx    Cancer Neg Hx    Diabetes Neg Hx    Heart disease Neg Hx     Social History   Tobacco Use   Smoking status: Never   Smokeless tobacco: Never  Vaping Use   Vaping status: Never Used  Substance Use Topics   Alcohol use: No   Drug use: Never    Allergies: No Known Allergies  Medications Prior to Admission  Medication Sig Dispense Refill Last Dose   metFORMIN (GLUCOPHAGE) 500 MG tablet Take 500 mg by mouth 2 (two) times daily with a meal.   Past Month   Prenatal Vit-Fe  Fumarate-FA (MULTIVITAMIN-PRENATAL) 27-0.8 MG TABS tablet Take 1 tablet by mouth daily at 12 noon.       Review of Systems  Constitutional: Negative.   HENT: Negative.    Eyes: Negative.   Respiratory: Negative.    Cardiovascular: Negative.   Gastrointestinal: Negative.   Endocrine: Negative.   Genitourinary:  Positive for pelvic pain (contractions) and vaginal bleeding (bloody show with wiping).  Musculoskeletal: Negative.   Skin: Negative.   Allergic/Immunologic: Negative.   Neurological: Negative.   Hematological: Negative.   Psychiatric/Behavioral: Negative.     Physical Exam   Patient Vitals for the past 24 hrs:  BP Temp Temp src Pulse Resp SpO2 Height Weight  01/24/23 1015 (!) 144/96 -- -- 87 -- -- -- --  01/24/23 1000 (!) 142/91 -- -- 89 -- -- -- --  01/24/23 0945 (!) 140/83 -- -- 87 -- -- -- --  01/24/23 0930 (!) 135/92 -- -- 91 -- -- -- --  01/24/23 0915 (!) 144/88 -- -- 82 -- 100 % -- --  01/24/23  0900 (!) 136/90 -- -- 86 -- 100 % -- --  01/24/23 0845 135/83 -- -- 87 -- -- -- --  01/24/23 0830 137/89 -- -- 84 -- -- -- --  01/24/23 0826 136/85 -- -- 88 -- -- -- --  01/24/23 0810 (!) 143/82 98.1 F (36.7 C) Oral 89 18 100 % -- --  01/24/23 0804 -- -- -- -- -- -- 5\' 3"  (1.6 m) 124.3 kg    Physical Exam Vitals and nursing note reviewed.  Constitutional:      Appearance: Normal appearance. She is obese.  Cardiovascular:     Rate and Rhythm: Normal rate and regular rhythm.     Pulses: Normal pulses.     Heart sounds: Normal heart sounds.  Pulmonary:     Effort: Pulmonary effort is normal.     Breath sounds: Normal breath sounds.  Abdominal:     Palpations: Abdomen is soft.  Genitourinary:    Comments: Dilation: 2.5 Effacement (%): 70 Cervical Position: Posterior Station: -3 Presentation: Vertex Exam by: Imagene Sheller RN  Musculoskeletal:        General: Normal range of motion.  Skin:    General: Skin is warm and dry.  Neurological:     Mental Status:  She is alert and oriented to person, place, and time.  Psychiatric:        Mood and Affect: Mood normal.        Behavior: Behavior normal.        Thought Content: Thought content normal.        Judgment: Judgment normal.    REACTIVE NST - FHR: 145 bpm / moderate variability / accels present / decels absent / TOCO: irregular UC's  Reassessment @ 1030: Called in room by [employer], RN. RN placing patient from LT lateral position into hands and knees position. Patient was up to BR reporting "back-to-back UC's" when RN tried to get her back on the monitor there was a prolonged decel in FHR audible in 60's. Patient changed to back lying position for BS U/S. BS U/S brought in by CNM *see procedural documentation below*. FHR 110 bpm and increasing to baseline. FHR returned to baseline. RN attempting to start IV in the process.  MAU Course  Procedures Patient informed that the ultrasound is considered a limited OB ultrasound and is not intended to be a complete ultrasound exam.  Patient also informed that the ultrasound is not being completed with the intent of assessing for fetal or placental anomalies or any pelvic abnormalities.  Explained that the purpose of today's ultrasound is to assess for viability. Heart rate was visibly slow @ ~100 bpm, but increasing. EFM placed on by RN at that time. Baby was found to be in a cephalic presentation. Patient acknowledges the purpose of the exam and the limitations of the study.   MDM CCUA CBC CMP P/C Ratio Serial BP's   Results for orders placed or performed during the hospital encounter of 01/24/23 (from the past 24 hour(s))  Urinalysis, Routine w reflex microscopic -Urine, Clean Catch     Status: Abnormal   Collection Time: 01/24/23  8:43 AM  Result Value Ref Range   Color, Urine YELLOW YELLOW   APPearance HAZY (A) CLEAR   Specific Gravity, Urine 1.021 1.005 - 1.030   pH 6.0 5.0 - 8.0   Glucose, UA NEGATIVE NEGATIVE mg/dL   Hgb urine dipstick  NEGATIVE NEGATIVE   Bilirubin Urine NEGATIVE NEGATIVE   Ketones, ur NEGATIVE NEGATIVE mg/dL  Protein, ur NEGATIVE NEGATIVE mg/dL   Nitrite NEGATIVE NEGATIVE   Leukocytes,Ua NEGATIVE NEGATIVE  Protein / creatinine ratio, urine     Status: None   Collection Time: 01/24/23  8:57 AM  Result Value Ref Range   Creatinine, Urine 182 mg/dL   Total Protein, Urine 28 mg/dL   Protein Creatinine Ratio 0.15 0.00 - 0.15 mg/mg[Cre]  CBC     Status: Abnormal   Collection Time: 01/24/23  9:13 AM  Result Value Ref Range   WBC 6.3 4.0 - 10.5 K/uL   RBC 4.59 3.87 - 5.11 MIL/uL   Hemoglobin 11.7 (L) 12.0 - 15.0 g/dL   HCT 14.7 82.9 - 56.2 %   MCV 80.8 80.0 - 100.0 fL   MCH 25.5 (L) 26.0 - 34.0 pg   MCHC 31.5 30.0 - 36.0 g/dL   RDW 13.0 86.5 - 78.4 %   Platelets 299 150 - 400 K/uL   nRBC 0.0 0.0 - 0.2 %  Comprehensive metabolic panel     Status: Abnormal   Collection Time: 01/24/23  9:13 AM  Result Value Ref Range   Sodium 137 135 - 145 mmol/L   Potassium 3.8 3.5 - 5.1 mmol/L   Chloride 105 98 - 111 mmol/L   CO2 21 (L) 22 - 32 mmol/L   Glucose, Bld 86 70 - 99 mg/dL   BUN 8 6 - 20 mg/dL   Creatinine, Ser 6.96 0.44 - 1.00 mg/dL   Calcium 8.6 (L) 8.9 - 10.3 mg/dL   Total Protein 6.8 6.5 - 8.1 g/dL   Albumin 2.5 (L) 3.5 - 5.0 g/dL   AST 17 15 - 41 U/L   ALT 19 0 - 44 U/L   Alkaline Phosphatase 124 38 - 126 U/L   Total Bilirubin 0.4 0.3 - 1.2 mg/dL   GFR, Estimated >29 >52 mL/min   Anion gap 11 5 - 15      Assessment and Plan  1. Elevated blood pressure reading in office without diagnosis of hypertension - Admit to L&D - Release IOL orders - Call Dr. Reina Fuse for other orders - Type and screen - RPR  2. NST (non-stress test) nonreactive 3. [redacted] weeks gestation of pregnancy   Raelyn Mora, CNM 01/24/2023, 8:56 AM

## 2023-01-24 NOTE — H&P (Signed)
Jessica Christian is a 22 y.o. female presenting for evaluation of contractions. Worsening in pain and regularity since 0600.Scheduled for IOL tomorrow. +FM, denies VB, LOF  PNC c.b 1) GDMA2 - controlled on PO metformin 2) BMI 48 - baby ASA Weekly BPP 8/8 since 34 wks, EFW @ 36wks 2623 gm 5 lb 13 oz 33 %   GBS pos, no PCN allergy  OB History     Gravida  3   Para  1   Term  1   Preterm      AB  1   Living  1      SAB  1   IAB      Ectopic      Multiple  0   Live Births  1          Past Medical History:  Diagnosis Date   Anemia    Iron deficiency 02/03/2018   Pregnancy induced hypertension    Vitamin D insufficiency 02/03/2018   Past Surgical History:  Procedure Laterality Date   WISDOM TOOTH EXTRACTION     Family History: family history includes Healthy in her father and mother; Hypertension in her maternal grandmother. Social History:  reports that she has never smoked. She has never used smokeless tobacco. She reports that she does not drink alcohol and does not use drugs.     Maternal Diabetes: Yes:  Diabetes Type:  Insulin/Medication controlled Genetic Screening: Normal Maternal Ultrasounds/Referrals: Normal Fetal Ultrasounds or other Referrals:  None Maternal Substance Abuse:  No Significant Maternal Medications:  Meds include: Other:  Significant Maternal Lab Results:  Group B Strep positive Number of Prenatal Visits:greater than 3 verified prenatal visits Other Comments:  None  Review of Systems  Constitutional:  Negative for chills and fever.  Respiratory:  Negative for shortness of breath.   Cardiovascular:  Negative for chest pain, palpitations and leg swelling.  Gastrointestinal:  Positive for abdominal pain (w/ ctx). Negative for nausea and vomiting.  Neurological:  Negative for dizziness, weakness and headaches.  Psychiatric/Behavioral:  Negative for suicidal ideas.    Maternal Medical History:  Reason for admission: Contractions.   Nausea.  Contractions: Onset was 1-2 hours ago.   Frequency: regular.   Perceived severity is strong.   Fetal activity: Perceived fetal activity is normal.   Prenatal complications: No PIH, IUGR, pre-eclampsia or preterm labor.   Prenatal Complications - Diabetes: gestational. Diabetes is managed by oral agent (monotherapy).     Dilation: 3.5 Effacement (%): 70 Station: -3 Exam by:: [employer] Blood pressure (!) 183/100, pulse (!) 101, temperature 98.1 F (36.7 C), temperature source Oral, resp. rate 18, height 5\' 3"  (1.6 m), weight 124.3 kg, last menstrual period 04/19/2022, SpO2 100%, currently breastfeeding. Exam Physical Exam Constitutional:      General: She is not in acute distress.    Appearance: She is well-developed.  HENT:     Head: Normocephalic and atraumatic.  Eyes:     Pupils: Pupils are equal, round, and reactive to light.  Cardiovascular:     Rate and Rhythm: Normal rate and regular rhythm.     Heart sounds: No murmur heard.    No gallop.  Abdominal:     Tenderness: There is no abdominal tenderness. There is no guarding or rebound.  Genitourinary:    Vagina: Normal.     Uterus: Normal.   Musculoskeletal:        General: Normal range of motion.     Cervical back: Normal range of  motion and neck supple.  Skin:    General: Skin is warm and dry.  Neurological:     Mental Status: She is alert and oriented to person, place, and time.     Prenatal labs: ABO, Rh: --/--/PENDING (08/19 1134) Antibody: NEG (08/19 1134) Rubella: Immune (02/08 0000) RPR: Nonreactive (02/08 0000)  HBsAg: Negative (02/08 0000)  HIV: Non-reactive (02/08 0000)  GBS: Positive/-- (07/30 0000)  Cat 1 tracing TOCO q4-66m One spontaneous prolonged decel @ 1030 for down to 90s with SRTB with repositioning  Labs: uPC 0.15, 11.7/37.1/299, AST/ALT 17/19, Cr 0.82 Assessment/Plan: This is a 22yo G3P1011 @ 39 2/7 presenting in early labor. PNC c/b GDMA2 on metformin,  well-controlled. Elevated BP on admission with one-time deceleration. Planned IOL tomorrow. Given EGA, fetal tracing and elevated BP, admit now in early labor. Plan for AROM after epidural. PCN for GBS ppx. Anticipate SVD, augment as needed PreE labs dawn on admission, pt asymptomatic    Carlisle Cater 01/24/2023, 12:59 PM

## 2023-01-24 NOTE — MAU Note (Signed)
Notifed Tara from main lab again for PCR add-on to UA.

## 2023-01-24 NOTE — MAU Note (Signed)
.  Jessica Christian is a 22 y.o. at [redacted]w[redacted]d here in MAU reporting: she's having ctxs that 5 minutes apart since 0600 this morning.  Denies VB similat to cycle or LOF, but has VB with wiping..  Reports +FM. LMP: NA Onset of complaint: today Pain score: 8 Vitals:   01/24/23 0810  BP: (!) 143/82  Pulse: 89  Resp: 18  Temp: 98.1 F (36.7 C)  SpO2: 100%     FHR:  152 bpm Lab orders placed from triage:   UA

## 2023-01-24 NOTE — Anesthesia Preprocedure Evaluation (Signed)
Anesthesia Evaluation  Patient identified by MRN, date of birth, ID band Patient awake    Reviewed: Allergy & Precautions, Patient's Chart, lab work & pertinent test results  History of Anesthesia Complications Negative for: history of anesthetic complications  Airway Mallampati: II  TM Distance: >3 FB Neck ROM: Full    Dental no notable dental hx.    Pulmonary neg pulmonary ROS   Pulmonary exam normal        Cardiovascular hypertension, Normal cardiovascular exam     Neuro/Psych negative neurological ROS  negative psych ROS   GI/Hepatic negative GI ROS, Neg liver ROS,,,  Endo/Other  diabetes, Gestational  Morbid obesity  Renal/GU negative Renal ROS  negative genitourinary   Musculoskeletal negative musculoskeletal ROS (+)    Abdominal   Peds  Hematology negative hematology ROS (+)   Anesthesia Other Findings Day of surgery medications reviewed with patient.  Reproductive/Obstetrics (+) Pregnancy                             Anesthesia Physical Anesthesia Plan  ASA: 3  Anesthesia Plan: Epidural   Post-op Pain Management:    Induction:   PONV Risk Score and Plan: Treatment may vary due to age or medical condition  Airway Management Planned: Natural Airway  Additional Equipment: Fetal Monitoring  Intra-op Plan:   Post-operative Plan:   Informed Consent: I have reviewed the patients History and Physical, chart, labs and discussed the procedure including the risks, benefits and alternatives for the proposed anesthesia with the patient or authorized representative who has indicated his/her understanding and acceptance.       Plan Discussed with:   Anesthesia Plan Comments:        Anesthesia Quick Evaluation

## 2023-01-24 NOTE — MAU Note (Signed)
Notified Main Lab of Protein Creatinine Ratio add on.

## 2023-01-25 ENCOUNTER — Inpatient Hospital Stay (HOSPITAL_COMMUNITY): Payer: Medicaid Other

## 2023-01-25 LAB — CBC
HCT: 31.5 % — ABNORMAL LOW (ref 36.0–46.0)
Hemoglobin: 10 g/dL — ABNORMAL LOW (ref 12.0–15.0)
MCH: 25.7 pg — ABNORMAL LOW (ref 26.0–34.0)
MCHC: 31.7 g/dL (ref 30.0–36.0)
MCV: 81 fL (ref 80.0–100.0)
Platelets: 281 10*3/uL (ref 150–400)
RBC: 3.89 MIL/uL (ref 3.87–5.11)
RDW: 14.4 % (ref 11.5–15.5)
WBC: 9.7 10*3/uL (ref 4.0–10.5)
nRBC: 0 % (ref 0.0–0.2)

## 2023-01-25 LAB — GLUCOSE, CAPILLARY: Glucose-Capillary: 72 mg/dL (ref 70–99)

## 2023-01-25 NOTE — Lactation Note (Signed)
This note was copied from a baby's chart. Lactation Consultation Note Experienced BF mom stopped BF her 21 yr old daughter 08/2022 after BF for 10 months. Stopped BF d/t biting. Mom stated this baby has been started cluster feeding but it is hurting.  Baby has recessed chin that is where it is hurting. Instructed mom to do chin tug. Mom stated much better. Mom prefers the cradle hold. Mom stated that frequently during the feeding she has to do chin tug. Explained baby is learning to feed and may take a little bit for baby to learn to keep wide flange. The chin will continue to grow and that will make it easier as well. Newborn feeding habits, STS, I&O, positioning, support, supply and demand reviewed. Mom encouraged to feed baby 8-12 times/24 hours and with feeding cues.  Praised mom for doing a good job BF. Encouraged to call for assistance or questions.  Patient Name: Jessica Christian [id number] Date: 01/25/2023 Age:7 hours Reason for consult: Initial assessment;Term   Maternal Data Has patient been taught Hand Expression?: Yes Does the patient have breastfeeding experience prior to this delivery?: Yes How long did the patient breastfeed?: 10 months. stopped BF in March 2024  Feeding    LATCH Score Latch: Grasps breast easily, tongue down, lips flanged, rhythmical sucking.  Audible Swallowing: A few with stimulation  Type of Nipple: Everted at rest and after stimulation  Comfort (Breast/Nipple): Soft / non-tender  Hold (Positioning): Assistance needed to correctly position infant at breast and maintain latch.  LATCH Score: 8   Lactation Tools Discussed/Used    Interventions Interventions: Breast feeding basics reviewed;Assisted with latch;Breast massage;Hand express;Breast compression;Adjust position;Support pillows;LC Services brochure  Discharge    Consult Status Consult Status: Follow-up Date: 01/26/23 Follow-up type: In-patient    Charyl Dancer 01/25/2023,  11:49 PM

## 2023-01-25 NOTE — Progress Notes (Signed)
Post Partum Day 1 Subjective: no complaints, up ad lib, voiding, and tolerating PO  Objective: Blood pressure 122/72, pulse 78, temperature 98.4 F (36.9 C), resp. rate 18, height 5\' 3"  (1.6 m), weight 124.3 kg, last menstrual period 04/19/2022, SpO2 100%, unknown if currently breastfeeding.  Physical Exam:  General: alert, cooperative, and appears stated age Lochia: appropriate Uterine Fundus: firm DVT Evaluation: No evidence of DVT seen on physical exam.  Recent Labs    01/24/23 0913 01/25/23 0506  HGB 11.7* 10.0*  HCT 37.1 31.5*    Assessment/Plan: Plan for discharge tomorrow Breastfeeding Desires neonatal circumcision, R/B/A of procedure discussed at length. Pt understands that neonatal circumcision is not considered medically necessary and is elective. The risks include, but are not limited to bleeding, infection, damage to the penis, development of scar tissue, and having to have it redone at a later date. Pt understands theses risks and wishes to proceed    LOS: 1 day   Waynard Reeds, MD 01/25/2023, 10:51 AM

## 2023-01-25 NOTE — Progress Notes (Signed)
Baby had an episode of coughing with choking and spitting up while in the nursery waiting for circumcision.  Peds requests postponement of circumcision until tomorrow

## 2023-01-25 NOTE — Anesthesia Postprocedure Evaluation (Signed)
Anesthesia Post Note  Patient: Engineer, civil (consulting)  Procedure(s) Performed: AN AD HOC LABOR EPIDURAL     Patient location during evaluation: Mother Baby Anesthesia Type: Epidural Level of consciousness: awake and alert Pain management: pain level controlled Vital Signs Assessment: post-procedure vital signs reviewed and stable Respiratory status: spontaneous breathing, nonlabored ventilation and respiratory function stable Cardiovascular status: stable Postop Assessment: no headache, no backache and epidural receding Anesthetic complications: no Comments: Per SRNA   No notable events documented.  Last Vitals:  Vitals:   01/24/23 2354 01/25/23 0404  BP: 124/76 122/72  Pulse: 79 78  Resp: 18 18  Temp: 36.9 C   SpO2: 100% 100%    Last Pain:  Vitals:   01/25/23 0727  TempSrc:   PainSc: 0-No pain   Pain Goal:                   Arnold Depinto

## 2023-01-26 LAB — SURGICAL PATHOLOGY

## 2023-01-26 MED ORDER — IBUPROFEN 600 MG PO TABS: 600.0000 mg | ORAL_TABLET | Freq: Four times a day (QID) | ORAL | 0 refills | Status: AC

## 2023-01-26 NOTE — Progress Notes (Signed)
PPD #2 No problems Afeb, VSS D/c home 

## 2023-01-26 NOTE — Discharge Instructions (Signed)
As per discharge pamphlet °

## 2023-01-26 NOTE — Discharge Summary (Signed)
Postpartum Discharge Summary      Patient Name: Jessica Christian DOB: 12/08/2000 MRN: [id number]  Date of admission: 01/24/2023 Delivery date:01/24/2023 Delivering provider: Carlisle Cater Date of discharge: 01/26/2023  Admitting diagnosis: Oral hypoglycemic controlled White classification A2 gestational diabetes mellitus (GDM) [O24.415] Intrauterine pregnancy: [redacted]w[redacted]d     Secondary diagnosis:  Principal Problem:   Oral hypoglycemic controlled White classification A2 gestational diabetes mellitus (GDM)    Discharge diagnosis: Term Pregnancy Delivered and GDM A2                                               Hospital course: Onset of Labor With Vaginal Delivery      22 y.o. yo [id number] at [redacted]w[redacted]d was admitted in Latent Labor on 01/24/2023. Labor course was complicated by nothing  Membrane Rupture Time/Date: 1:20 PM,01/24/2023  Delivery Method:Vaginal, Spontaneous Episiotomy: None Lacerations:  None Patient had a postpartum course complicated by nothing.  She is ambulating, tolerating a regular diet, passing flatus, and urinating well. Patient is discharged home in stable condition on 01/26/23.  Newborn Data: Birth date:01/24/2023 Birth time:1:22 PM Gender:Female Living status:Living Apgars:8 ,9  Weight:3320 g  Physical exam  Vitals:   01/25/23 0404 01/25/23 1412 01/25/23 2028 01/26/23 0350  BP: 122/72 123/87 123/60 128/73  Pulse: 78 84 82 81  Resp: 18  20 16   Temp:  97.7 F (36.5 C) 97.9 F (36.6 C) 97.9 F (36.6 C)  TempSrc: Oral Oral Oral Oral  SpO2: 100% 100% 99% 96%  Weight:      Height:       General: alert Lochia: appropriate Uterine Fundus: firm  Labs: Lab Results  Component Value Date   WBC 9.7 01/25/2023   HGB 10.0 (L) 01/25/2023   HCT 31.5 (L) 01/25/2023   MCV 81.0 01/25/2023   PLT 281 01/25/2023      Latest Ref Rng & Units 01/24/2023    9:13 AM  CMP  Glucose 70 - 99 mg/dL 86   BUN 6 - 20 mg/dL 8   Creatinine 2.70 - 3.50 mg/dL 0.93   Sodium 818 -  299 mmol/L 137   Potassium 3.5 - 5.1 mmol/L 3.8   Chloride 98 - 111 mmol/L 105   CO2 22 - 32 mmol/L 21   Calcium 8.9 - 10.3 mg/dL 8.6   Total Protein 6.5 - 8.1 g/dL 6.8   Total Bilirubin 0.3 - 1.2 mg/dL 0.4   Alkaline Phos 38 - 126 U/L 124   AST 15 - 41 U/L 17   ALT 0 - 44 U/L 19    Edinburgh Score:    01/24/2023    3:25 PM  Edinburgh Postnatal Depression Scale Screening Tool  I have been able to laugh and see the funny side of things. 0  I have looked forward with enjoyment to things. 0  I have blamed myself unnecessarily when things went wrong. 1  I have been anxious or worried for no good reason. 0  I have felt scared or panicky for no good reason. 0  Things have been getting on top of me. 0  I have been so unhappy that I have had difficulty sleeping. 1  I have felt sad or miserable. 0  I have been so unhappy that I have been crying. 0  The thought of harming myself has occurred to me. 0  Edinburgh Postnatal Depression Scale Total 2      After visit meds:  Allergies as of 01/26/2023   No Known Allergies      Medication List     STOP taking these medications    metFORMIN 500 MG tablet Commonly known as: GLUCOPHAGE       TAKE these medications    ibuprofen 600 MG tablet Commonly known as: ADVIL Take 1 tablet (600 mg total) by mouth every 6 (six) hours.   multivitamin-prenatal 27-0.8 MG Tabs tablet Take 1 tablet by mouth daily at 12 noon.         Discharge home in stable condition Infant Feeding: Breast Infant Disposition:home with mother Discharge instruction: per After Visit Summary and Postpartum booklet. Activity: Advance as tolerated. Pelvic rest for 6 weeks.  Diet: routine diet Postpartum Appointment:4 weeks Future Appointments: Future Appointments  Date Time Provider Department Center  03/04/2023  3:40 PM Tobb, Lavona Mound, DO [facility] None   Follow up Visit:  Follow-up Information     Ob/Gyn, [facility]. Schedule an appointment as  soon as possible for a visit in 4 week(s).   Contact information: [address] [place] [address] [phone]                     01/26/2023 Zenaida Niece, MD

## 2023-01-26 NOTE — Lactation Note (Signed)
This note was copied from a baby's chart. Lactation Consultation Note  Patient Name: Jessica Christian [id number] Date: 01/26/2023 Age:21 hours. P2  Reason for consult: Follow-up assessment;Term;Infant weight loss;Breastfeeding assistance (9 % weight loss, post circ , spitty and sleepy). LC reviewed the doc flow sheets and the output correlates with the 9 % weight loss . Mom also mentioned her breast are fuller, milk is coming in and hand expresses well.  Mom mentioned her areolas were tender. LC offered to assess breast tissue and mom receptive. Attempted to latch, LC assessed and noted areola edema. Showed mom the reverse pressure and the areola soften to a thinner sandwich for a deeper latch.  Mom had mentioned it felt like the baby was biting when latching.  LC reviewed BF D/C teaching and the [facility] resources.    Maternal Data Has patient been taught Hand Expression?: Yes (excellent flow)  Feeding Mother's Current Feeding Choice: Breast Milk  LATCH Score Latch: Too sleepy or reluctant, no latch achieved, no sucking elicited. (baby is post circ , spitty , sleepy and not insterested in feeding. Last fed at 8 am for 30 mins)  Audible Swallowing: None  Type of Nipple: Everted at rest and after stimulation (areola edema)  Comfort (Breast/Nipple): Filling, red/small blisters or bruises, mild/mod discomfort  Hold (Positioning): Assistance needed to correctly position infant at breast and maintain latch.  LATCH Score: 4   Lactation Tools Discussed/Used Breast pump type: Double-Electric Breast Pump;Manual (was already set up) Pump Education: Milk Storage  Interventions Interventions: Breast feeding basics reviewed;Assisted with latch;Skin to skin;Breast massage;Hand express;Reverse pressure;Adjust position;Support pillows;Position options;Coconut oil;Hand pump;DEBP;Education;LC Services brochure  Discharge Discharge Education: Engorgement and breast care;Warning signs for feeding  baby Pump: DEBP;Manual;Personal  Consult Status Consult Status: Complete Date: 01/26/23    Kathrin Greathouse 01/26/2023, 10:52 AM

## 2023-01-31 ENCOUNTER — Inpatient Hospital Stay (HOSPITAL_COMMUNITY)
Admission: AD | Admit: 2023-01-31 | Discharge: 2023-01-31 | Disposition: A | Discharge status: Home / Self Care | Payer: Medicaid Other | Attending: Obstetrics and Gynecology | Admitting: Obstetrics and Gynecology

## 2023-01-31 ENCOUNTER — Encounter (HOSPITAL_COMMUNITY): Payer: Self-pay | Admitting: Obstetrics and Gynecology

## 2023-01-31 DIAGNOSIS — M25472 Effusion, left ankle: Secondary | ICD-10-CM | POA: Insufficient documentation

## 2023-01-31 DIAGNOSIS — M7989 Other specified soft tissue disorders: Secondary | ICD-10-CM | POA: Diagnosis not present

## 2023-01-31 DIAGNOSIS — O99893 Other specified diseases and conditions complicating puerperium: Secondary | ICD-10-CM | POA: Insufficient documentation

## 2023-01-31 DIAGNOSIS — R030 Elevated blood-pressure reading, without diagnosis of hypertension: Secondary | ICD-10-CM | POA: Insufficient documentation

## 2023-01-31 DIAGNOSIS — M25471 Effusion, right ankle: Secondary | ICD-10-CM | POA: Insufficient documentation

## 2023-01-31 LAB — COMPREHENSIVE METABOLIC PANEL
ALT: 23 U/L (ref 0–44)
AST: 16 U/L (ref 15–41)
Albumin: 2.6 g/dL — ABNORMAL LOW (ref 3.5–5.0)
Alkaline Phosphatase: 79 U/L (ref 38–126)
Anion gap: 11 (ref 5–15)
BUN: 16 mg/dL (ref 6–20)
CO2: 23 mmol/L (ref 22–32)
Calcium: 8.5 mg/dL — ABNORMAL LOW (ref 8.9–10.3)
Chloride: 104 mmol/L (ref 98–111)
Creatinine, Ser: 0.86 mg/dL (ref 0.44–1.00)
GFR, Estimated: >60 mL/min (ref >60)
Glucose, Bld: 86 mg/dL (ref 70–99)
Potassium: 3.9 mmol/L (ref 3.5–5.1)
Sodium: 138 mmol/L (ref 135–145)
Total Bilirubin: 0.4 mg/dL (ref 0.3–1.2)
Total Protein: 6.7 g/dL (ref 6.5–8.1)

## 2023-01-31 LAB — CBC WITH DIFFERENTIAL/PLATELET
Abs Immature Granulocytes: 0.04 10*3/uL (ref 0.00–0.07)
Basophils Absolute: 0 10*3/uL (ref 0.0–0.1)
Basophils Relative: 0 %
Eosinophils Absolute: 0.1 10*3/uL (ref 0.0–0.5)
Eosinophils Relative: 2 %
HCT: 35.7 % — ABNORMAL LOW (ref 36.0–46.0)
Hemoglobin: 10.9 g/dL — ABNORMAL LOW (ref 12.0–15.0)
Immature Granulocytes: 1 %
Lymphocytes Relative: 39 %
Lymphs Abs: 2.6 10*3/uL (ref 0.7–4.0)
MCH: 25.3 pg — ABNORMAL LOW (ref 26.0–34.0)
MCHC: 30.5 g/dL (ref 30.0–36.0)
MCV: 83 fL (ref 80.0–100.0)
Monocytes Absolute: 0.6 10*3/uL (ref 0.1–1.0)
Monocytes Relative: 8 %
Neutro Abs: 3.3 10*3/uL (ref 1.7–7.7)
Neutrophils Relative %: 50 %
Platelets: 342 10*3/uL (ref 150–400)
RBC: 4.3 MIL/uL (ref 3.87–5.11)
RDW: 14.7 % (ref 11.5–15.5)
WBC: 6.6 10*3/uL (ref 4.0–10.5)
nRBC: 0 % (ref 0.0–0.2)

## 2023-01-31 MED ORDER — FUROSEMIDE 20 MG PO TABS: 20.0000 mg | ORAL_TABLET | Freq: Every day | ORAL | 0 refills | Status: AC

## 2023-01-31 NOTE — MAU Provider Note (Signed)
History     CSN: [id number]  Arrival date and time: 01/31/23 0406   Event Date/Time   First Provider Initiated Contact with Patient 01/31/23 0518      Chief Complaint  Patient presents with   Hypertension   Leg Swelling   Jessica Christian , a  22 y.o. G3P2012 at 1 weeks PP presents to MAU with complaints of elevated BPs at home and swelling in the lower legs. She states that she got up to go the the bathroom and noted some swelling to both of her calfs and ankles. She states that she did not try to relieve symptoms. She also reports taking her BP when she noted the swelling and noted that her BPs were 150/90 and on recheck 160/111. She denies headache, blurred vision, and epigastric pain. She has no other complaints.        Hypertension Pertinent negatives include no chest pain, headaches, palpitations or shortness of breath.    OB History     Gravida  3   Para  2   Term  2   Preterm      AB  1   Living  2      SAB  1   IAB      Ectopic      Multiple  0   Live Births  2           Past Medical History:  Diagnosis Date   Anemia    Iron deficiency 02/03/2018   Pregnancy induced hypertension    Vitamin D insufficiency 02/03/2018    Past Surgical History:  Procedure Laterality Date   WISDOM TOOTH EXTRACTION      Family History  Problem Relation Age of Onset   Healthy Mother    Healthy Father    Hypertension Maternal Grandmother    Asthma Neg Hx    Cancer Neg Hx    Diabetes Neg Hx    Heart disease Neg Hx     Social History   Tobacco Use   Smoking status: Never   Smokeless tobacco: Never  Vaping Use   Vaping status: Never Used  Substance Use Topics   Alcohol use: No   Drug use: Never    Allergies: No Known Allergies  Medications Prior to Admission  Medication Sig Dispense Refill Last Dose   ibuprofen (ADVIL) 600 MG tablet Take 1 tablet (600 mg total) by mouth every 6 (six) hours. 30 tablet 0    Prenatal Vit-Fe Fumarate-FA  (MULTIVITAMIN-PRENATAL) 27-0.8 MG TABS tablet Take 1 tablet by mouth daily at 12 noon.       Review of Systems  Constitutional:  Negative for chills, fatigue and fever.  Eyes:  Negative for pain and visual disturbance.  Respiratory:  Negative for apnea, shortness of breath and wheezing.   Cardiovascular:  Negative for chest pain and palpitations.  Gastrointestinal:  Negative for abdominal pain, constipation, diarrhea, nausea and vomiting.  Genitourinary:  Negative for difficulty urinating, dysuria, pelvic pain, vaginal bleeding, vaginal discharge and vaginal pain.  Musculoskeletal:  Positive for joint swelling. Negative for back pain.  Neurological:  Negative for seizures, weakness and headaches.  Psychiatric/Behavioral:  Negative for suicidal ideas.    Physical Exam   Blood pressure (!) 143/90, pulse 68, temperature 98 F (36.7 C), temperature source Oral, resp. rate 17, height 5\' 3"  (1.6 m), weight 121.6 kg, SpO2 100%, unknown if currently breastfeeding.  Physical Exam Vitals and nursing note reviewed.  Constitutional:      General:  She is not in acute distress.    Appearance: Normal appearance.  HENT:     Head: Normocephalic.  Cardiovascular:     Rate and Rhythm: Normal rate.  Pulmonary:     Effort: Pulmonary effort is normal.     Breath sounds: Normal breath sounds.  Musculoskeletal:        General: No swelling.     Cervical back: Normal range of motion.     Right lower leg: No edema.     Left lower leg: No edema.     Comments: Mild bilateral non-pitting edema noted   Skin:    General: Skin is warm and dry.  Neurological:     Mental Status: She is alert and oriented to person, place, and time.     Deep Tendon Reflexes: Reflexes normal.  Psychiatric:        Mood and Affect: Mood normal.     MAU Course  Procedures Orders Placed This Encounter  Procedures   CBC with Differential/Platelet   Comprehensive metabolic panel   Results for orders placed or performed  during the hospital encounter of 01/31/23 (from the past 24 hour(s))  CBC with Differential/Platelet     Status: Abnormal   Collection Time: 01/31/23  5:05 AM  Result Value Ref Range   WBC 6.6 4.0 - 10.5 K/uL   RBC 4.30 3.87 - 5.11 MIL/uL   Hemoglobin 10.9 (L) 12.0 - 15.0 g/dL   HCT 13.2 (L) 44.0 - 10.2 %   MCV 83.0 80.0 - 100.0 fL   MCH 25.3 (L) 26.0 - 34.0 pg   MCHC 30.5 30.0 - 36.0 g/dL   RDW 72.5 36.6 - 44.0 %   Platelets 342 150 - 400 K/uL   nRBC 0.0 0.0 - 0.2 %   Neutrophils Relative % 50 %   Neutro Abs 3.3 1.7 - 7.7 K/uL   Lymphocytes Relative 39 %   Lymphs Abs 2.6 0.7 - 4.0 K/uL   Monocytes Relative 8 %   Monocytes Absolute 0.6 0.1 - 1.0 K/uL   Eosinophils Relative 2 %   Eosinophils Absolute 0.1 0.0 - 0.5 K/uL   Basophils Relative 0 %   Basophils Absolute 0.0 0.0 - 0.1 K/uL   Immature Granulocytes 1 %   Abs Immature Granulocytes 0.04 0.00 - 0.07 K/uL  Comprehensive metabolic panel     Status: Abnormal   Collection Time: 01/31/23  5:05 AM  Result Value Ref Range   Sodium 138 135 - 145 mmol/L   Potassium 3.9 3.5 - 5.1 mmol/L   Chloride 104 98 - 111 mmol/L   CO2 23 22 - 32 mmol/L   Glucose, Bld 86 70 - 99 mg/dL   BUN 16 6 - 20 mg/dL   Creatinine, Ser 3.47 0.44 - 1.00 mg/dL   Calcium 8.5 (L) 8.9 - 10.3 mg/dL   Total Protein 6.7 6.5 - 8.1 g/dL   Albumin 2.6 (L) 3.5 - 5.0 g/dL   AST 16 15 - 41 U/L   ALT 23 0 - 44 U/L   Alkaline Phosphatase 79 38 - 126 U/L   Total Bilirubin 0.4 0.3 - 1.2 mg/dL   GFR, Estimated >42 >59 mL/min   Anion gap 11 5 - 15   Patient Vitals for the past 24 hrs:  BP Temp Temp src Pulse Resp SpO2 Height Weight  01/31/23 0600 111/60 -- -- 76 -- -- -- --  01/31/23 0546 (!) 104/51 -- -- 68 -- -- -- --  01/31/23 0531 117/64 -- --  77 -- -- -- --  01/31/23 0516 (!) 104/50 -- -- 70 -- -- -- --  01/31/23 0501 (!) 150/86 -- -- 72 -- -- -- --  01/31/23 0438 (!) 143/90 98 F (36.7 C) Oral 68 17 100 % 5\' 3"  (1.6 m) 121.6 kg    MDM - 2 Elevated BPs  upon arrival to MAU. BPs spontaneously stabilized.  - Pre E labs normal. Low suspicion for preE  - plan for discharge.   Assessment and Plan   1. Elevated BP without diagnosis of hypertension   2. Postpartum care and examination   3. Leg swelling    - Reviewed worsening signs and return precautions. - Encouraged rest and elevation for swelling  - Reviewed PreE symptoms and when to return to MAU,  - Patient discharged home in stable condition and amy return to MAU as needed.   Claudette Head, MSN CNM  01/31/2023, 5:18 AM

## 2023-01-31 NOTE — MAU Note (Addendum)
.  Jessica Christian is a 22 y.o. PPD 7-arrived with complaints of increased swelling bilaterally in feet that began yesterday--took her BP. at 2330 last night pt reports BP 155/111.  Prior to arrival pt repeated BP and states 166/107. Pt states she was diagnosed with GHTN in previous pregnancy but not with current. Denies HA, visual changes or epigastric pain. Reports feeling SOB while lying down  Pain score: 3 lower abdominal cramping Vitals:   01/31/23 0438  BP: (!) 143/90  Pulse: 68  Resp: 17  Temp: 98 F (36.7 C)  SpO2: 100%      Lab orders placed from triage: see new orders

## 2023-02-23 ENCOUNTER — Telehealth (HOSPITAL_COMMUNITY): Payer: Self-pay | Admitting: *Deleted

## 2023-02-23 NOTE — Telephone Encounter (Signed)
02/23/2023  Name: Jessica Christian MRN: [id number] DOB: [birth year]  Reason for Call:  Transition of Care Hospital Discharge Call  Contact Status: Patient Contact Status: Complete  Language assistant needed: Interpreter Mode: Interpreter Not Needed        Follow-Up Questions: Do You Have Any Concerns About Your Health As You Heal From Delivery?: No Do You Have Any Concerns About Your Infants Health?: No  Edinburgh Postnatal Depression Scale:  In the Past 7 Days:    PHQ2-9 Depression Scale:     Discharge Follow-up: Edinburgh score requires follow up?:  (Patient declines screening today, completed one in the last week when she took her older daughter to pediatrician. She said her score was low and she feels she is doing well emotionally.) Patient was advised of the following resources:: Breastfeeding Support Group, Support Group  Post-discharge interventions: Reviewed Newborn Safe Sleep Practices  Salena Saner, RN 02/23/2023 15:58

## 2023-03-04 ENCOUNTER — Ambulatory Visit: Payer: Medicaid Other | Attending: Cardiology | Admitting: Cardiology

## 2023-03-07 ENCOUNTER — Encounter: Payer: Self-pay | Admitting: Cardiology

## 2023-07-05 ENCOUNTER — Emergency Department (HOSPITAL_BASED_OUTPATIENT_CLINIC_OR_DEPARTMENT_OTHER)
Admission: EM | Admit: 2023-07-05 | Discharge: 2023-07-05 | Disposition: A | Discharge status: Home / Self Care | Payer: Medicaid Other | Attending: Emergency Medicine | Admitting: Emergency Medicine

## 2023-07-05 ENCOUNTER — Other Ambulatory Visit: Payer: Self-pay

## 2023-07-05 DIAGNOSIS — L299 Pruritus, unspecified: Secondary | ICD-10-CM | POA: Diagnosis present

## 2023-07-05 DIAGNOSIS — R202 Paresthesia of skin: Secondary | ICD-10-CM | POA: Insufficient documentation

## 2023-07-05 DIAGNOSIS — R000 Tachycardia, unspecified: Secondary | ICD-10-CM | POA: Diagnosis not present

## 2023-07-05 LAB — CBC WITH DIFFERENTIAL/PLATELET
Abs Immature Granulocytes: 0.01 10*3/uL (ref 0.00–0.07)
Basophils Absolute: 0 10*3/uL (ref 0.0–0.1)
Basophils Relative: 0 %
Eosinophils Absolute: 0 10*3/uL (ref 0.0–0.5)
Eosinophils Relative: 1 %
HCT: 41.1 % (ref 36.0–46.0)
Hemoglobin: 13.1 g/dL (ref 12.0–15.0)
Immature Granulocytes: 0 %
Lymphocytes Relative: 29 %
Lymphs Abs: 1.9 10*3/uL (ref 0.7–4.0)
MCH: 26 pg (ref 26.0–34.0)
MCHC: 31.9 g/dL (ref 30.0–36.0)
MCV: 81.5 fL (ref 80.0–100.0)
Monocytes Absolute: 0.5 10*3/uL (ref 0.1–1.0)
Monocytes Relative: 8 %
Neutro Abs: 4.1 10*3/uL (ref 1.7–7.7)
Neutrophils Relative %: 62 %
Platelets: 353 10*3/uL (ref 150–400)
RBC: 5.04 MIL/uL (ref 3.87–5.11)
RDW: 14.6 % (ref 11.5–15.5)
WBC: 6.6 10*3/uL (ref 4.0–10.5)
nRBC: 0 % (ref 0.0–0.2)

## 2023-07-05 LAB — CBG MONITORING, ED: Glucose-Capillary: 138 mg/dL — ABNORMAL HIGH (ref 70–99)

## 2023-07-05 LAB — COMPREHENSIVE METABOLIC PANEL
ALT: 12 U/L (ref 0–44)
AST: 12 U/L — ABNORMAL LOW (ref 15–41)
Albumin: 4.1 g/dL (ref 3.5–5.0)
Alkaline Phosphatase: 63 U/L (ref 38–126)
Anion gap: 9 (ref 5–15)
BUN: 13 mg/dL (ref 6–20)
CO2: 25 mmol/L (ref 22–32)
Calcium: 9.3 mg/dL (ref 8.9–10.3)
Chloride: 104 mmol/L (ref 98–111)
Creatinine, Ser: 0.68 mg/dL (ref 0.44–1.00)
GFR, Estimated: >60 mL/min (ref >60)
Glucose, Bld: 83 mg/dL (ref 70–99)
Potassium: 4 mmol/L (ref 3.5–5.1)
Sodium: 138 mmol/L (ref 135–145)
Total Bilirubin: 0.5 mg/dL (ref 0.0–1.2)
Total Protein: 8.3 g/dL — ABNORMAL HIGH (ref 6.5–8.1)

## 2023-07-05 LAB — HCG, SERUM, QUALITATIVE: Preg, Serum: NEGATIVE

## 2023-07-05 LAB — MAGNESIUM: Magnesium: 1.7 mg/dL (ref 1.7–2.4)

## 2023-07-05 MED ORDER — ACETAMINOPHEN 500 MG PO TABS
1000.0000 mg | ORAL_TABLET | Freq: Once | ORAL | Status: AC
  Administered 2023-07-05: 1000 mg via ORAL
  Filled 2023-07-05: qty 2

## 2023-07-05 MED ORDER — SODIUM CHLORIDE 0.9 % IV BOLUS
1000.0000 mL | Freq: Once | INTRAVENOUS | Status: AC
  Administered 2023-07-05: 1000 mL via INTRAVENOUS

## 2023-07-05 NOTE — Discharge Instructions (Signed)
Your labs look good.  Please follow-up with your family doctor.  They may want to check a thyroid test on you again.  I would have you start to moisturize your hands and feet.  See if this improves her symptoms.

## 2023-07-05 NOTE — ED Provider Notes (Signed)
[place] EMERGENCY DEPARTMENT AT [facility] Provider Note   CSN: [id number] Arrival date & time: 07/05/23  1026     History  Chief Complaint  Patient presents with   Tachycardia    Jessica Christian is a 23 y.o. female.  23 yo F with a chief complaint of hand and foot itching.  Been going on for a couple days.  She went to urgent care was found to have a heart rate in the 130s and was sent here for evaluation.  Patient denies palpitations denies difficulty breathing denies chest pain denies abdominal pain denies nausea or vomiting.  Denies prior issues with her thyroid gland.        Home Medications Prior to Admission medications   Medication Sig Start Date End Date Taking? Authorizing Provider  furosemide (LASIX) 20 MG tablet Take 1 tablet (20 mg total) by mouth daily for 5 days. 01/31/23 02/05/23  Carlynn Herald, CNM  ibuprofen (ADVIL) 600 MG tablet Take 1 tablet (600 mg total) by mouth every 6 (six) hours. 01/26/23   Meisinger, Tawanna Cooler, MD  Prenatal Vit-Fe Fumarate-FA (MULTIVITAMIN-PRENATAL) 27-0.8 MG TABS tablet Take 1 tablet by mouth daily at 12 noon.    [provider]      Allergies    Patient has no known allergies.    Review of Systems   Review of Systems  Physical Exam Updated Vital Signs BP 126/64   Pulse 95   Temp 98.1 F (36.7 C)   Resp (!) 22   Ht 5\' 3"  (1.6 m)   Wt 110.2 kg   SpO2 100%   Breastfeeding Yes   BMI 43.05 kg/m  Physical Exam Vitals and nursing note reviewed.  Constitutional:      General: She is not in acute distress.    Appearance: She is well-developed. She is not diaphoretic.  HENT:     Head: Normocephalic and atraumatic.  Eyes:     Pupils: Pupils are equal, round, and reactive to light.  Cardiovascular:     Rate and Rhythm: Regular rhythm. Tachycardia present.     Heart sounds: No murmur heard.    No friction rub. No gallop.  Pulmonary:     Effort: Pulmonary effort is normal.     Breath sounds: No  wheezing or rales.  Abdominal:     General: There is no distension.     Palpations: Abdomen is soft.     Tenderness: There is no abdominal tenderness.  Musculoskeletal:        General: No tenderness.     Cervical back: Normal range of motion and neck supple.  Skin:    General: Skin is warm and dry.  Neurological:     Mental Status: She is alert and oriented to person, place, and time.  Psychiatric:        Behavior: Behavior normal.     ED Results / Procedures / Treatments   Labs (all labs ordered are listed, but only abnormal results are displayed) Labs Reviewed  COMPREHENSIVE METABOLIC PANEL - Abnormal; Notable for the following components:      Result Value   Total Protein 8.3 (*)    AST 12 (*)    All other components within normal limits  CBG MONITORING, ED - Abnormal; Notable for the following components:   Glucose-Capillary 138 (*)    All other components within normal limits  CBC WITH DIFFERENTIAL/PLATELET  MAGNESIUM  HCG, SERUM, QUALITATIVE    EKG EKG Interpretation Date/Time:  Tuesday July 05 2023 10:41:31 EST Ventricular Rate:  120 PR Interval:  122 QRS Duration:  78 QT Interval:  322 QTC Calculation: 455 R Axis:   34  Text Interpretation: Sinus tachycardia Biatrial enlargement Possible Anterior infarct , age undetermined Abnormal ECG pulmonary disease pattern on prior now resolved Otherwise no significant change Confirmed by Melene Plan [phone]) on 07/05/2023 11:34:18 AM  Radiology No results found.  Procedures Procedures    Medications Ordered in ED Medications  sodium chloride 0.9 % bolus 1,000 mL (0 mLs Intravenous Stopped 07/05/23 1312)  acetaminophen (TYLENOL) tablet 1,000 mg (1,000 mg Oral Given 07/05/23 1219)    ED Course/ Medical Decision Making/ A&P                                 Medical Decision Making Amount and/or Complexity of Data Reviewed Labs: ordered.  Risk OTC drugs.   23 yo F with a chief complaint of hand itching.   Clinically the patient looks like she has dry skin to the hands and feet.  I think likely is the cause of her symptoms.  She also is quite tachycardic.  She is not symptomatic with this.  Had normal TSH in July of last year.  Will obtain a laboratory evaluation.  Bolus of IV fluids.  Reassess.  Patient's tachycardia has resolved.  No anemia, no leukocytosis, no significant electrolyte abnormalities.  Pregnancy test negative.  Discharge home.  PCP follow-up.  2:59 PM:  I have discussed the diagnosis/risks/treatment options with the patient.  Evaluation and diagnostic testing in the emergency department does not suggest an emergent condition requiring admission or immediate intervention beyond what has been performed at this time.  They will follow up with PCP. We also discussed returning to the ED immediately if new or worsening sx occur. We discussed the sx which are most concerning (e.g., sudden worsening pain, fever, inability to tolerate by mouth) that necessitate immediate return. Medications administered to the patient during their visit and any new prescriptions provided to the patient are listed below.  Medications given during this visit Medications  sodium chloride 0.9 % bolus 1,000 mL (0 mLs Intravenous Stopped 07/05/23 1312)  acetaminophen (TYLENOL) tablet 1,000 mg (1,000 mg Oral Given 07/05/23 1219)     The patient appears reasonably screen and/or stabilized for discharge and I doubt any other medical condition or other [facility] requiring further screening, evaluation, or treatment in the ED at this time prior to discharge.         Final Clinical Impression(s) / ED Diagnoses Final diagnoses:  Hand tingling    Rx / DC Orders ED Discharge Orders     None         Melene Plan, DO 07/05/23 1459

## 2023-08-26 IMAGING — US US MFM FETAL BPP W/O NON-STRESS
1 series · 15 of 28 positions shown · non-contrast
Comparison: none

[Series 1: us mfm fetal bpp w/o non-stress · 32 acquisitions, 15 frames shown]
[im 1/32]
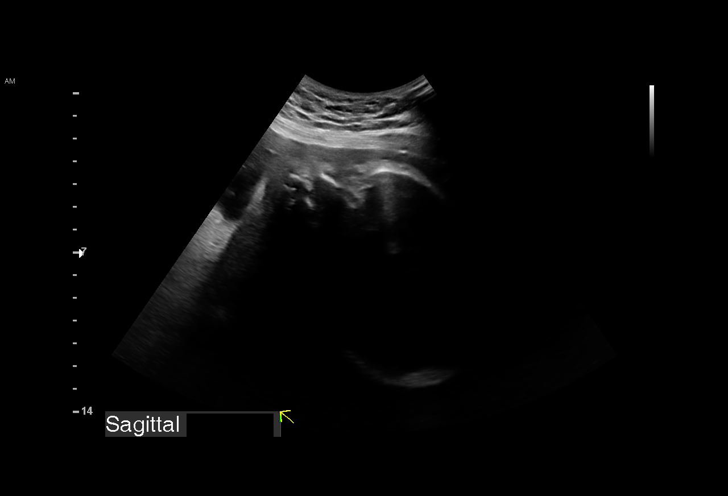
[im 3/32]
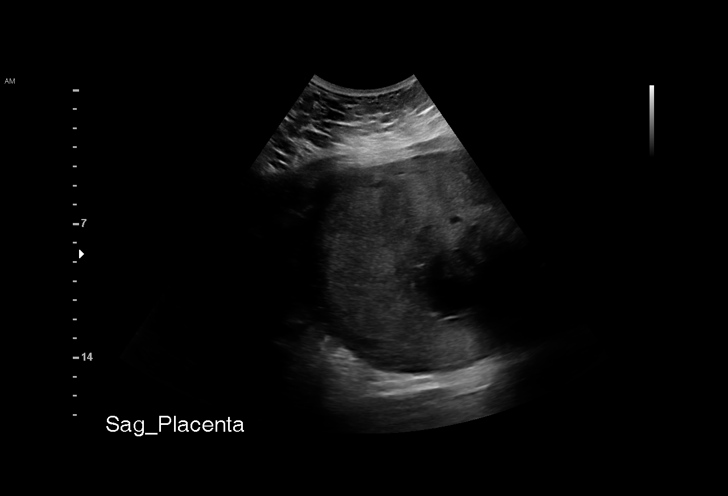
[im 5/32]
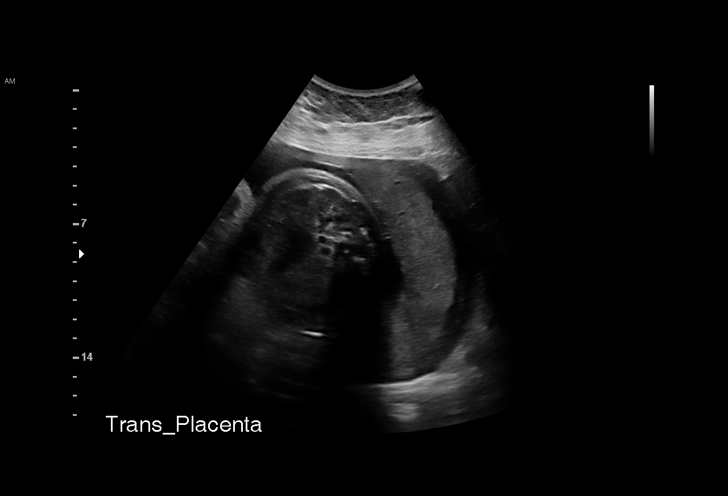
[im 7/32]
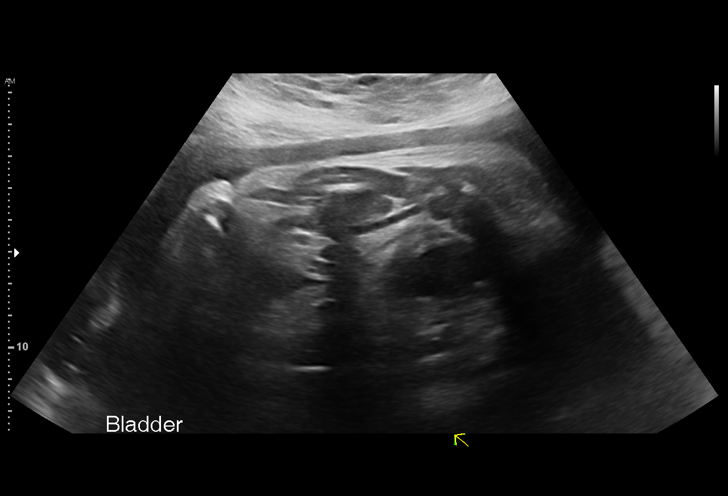
[im 10/32]
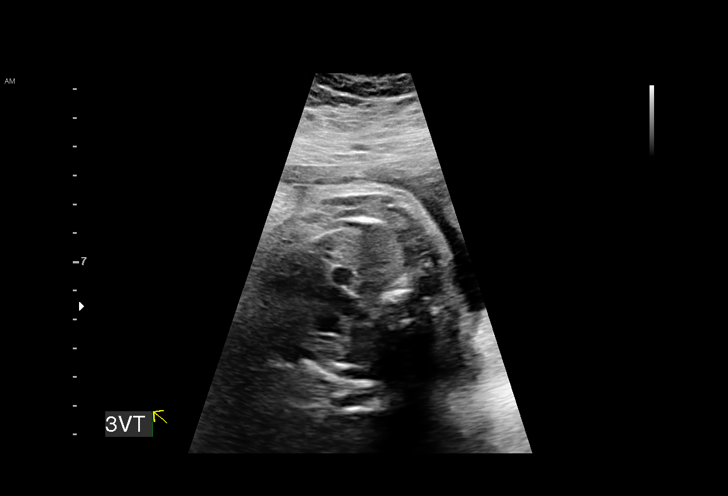
[im 12/32]
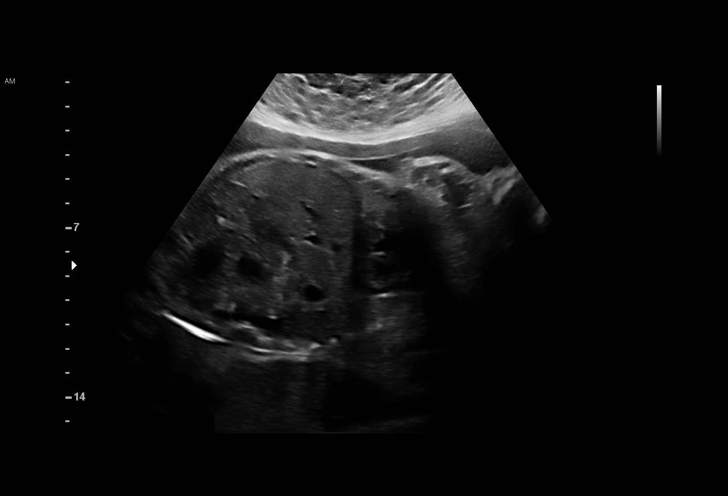
[im 14/32]
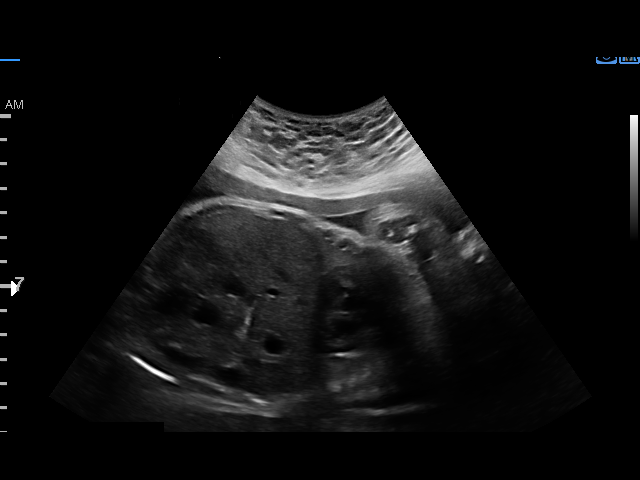
[im 17/32]
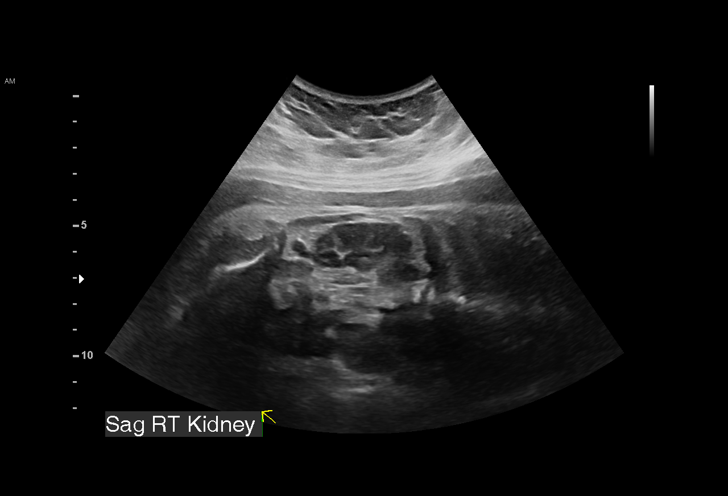
[im 18/32]
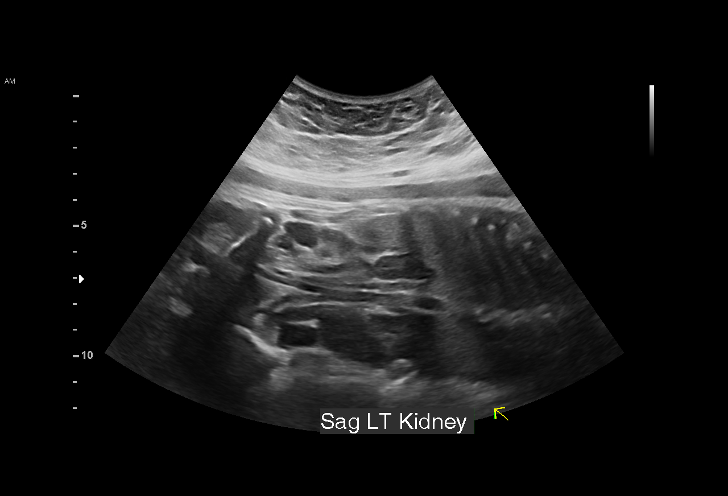
[im 20/32]
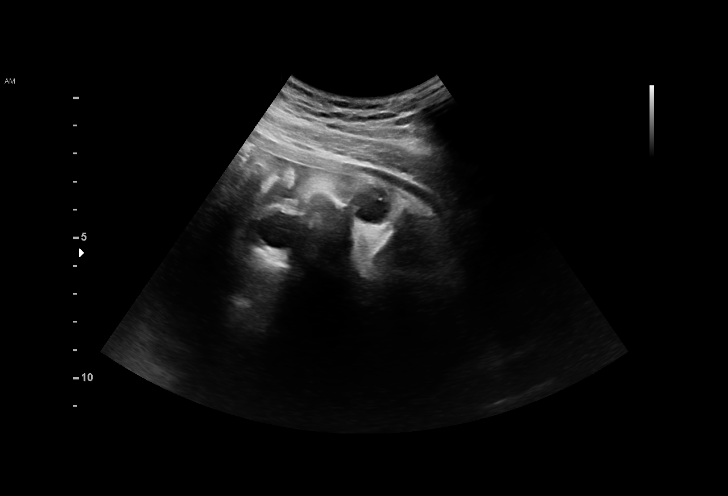
[im 22/32]
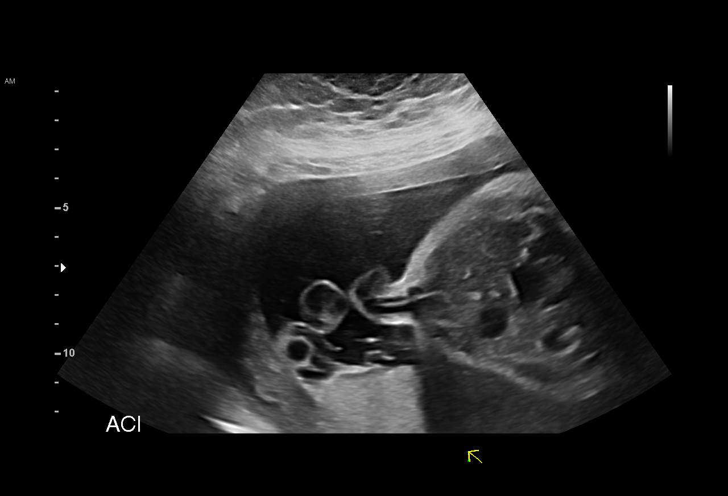
[im 25/32]
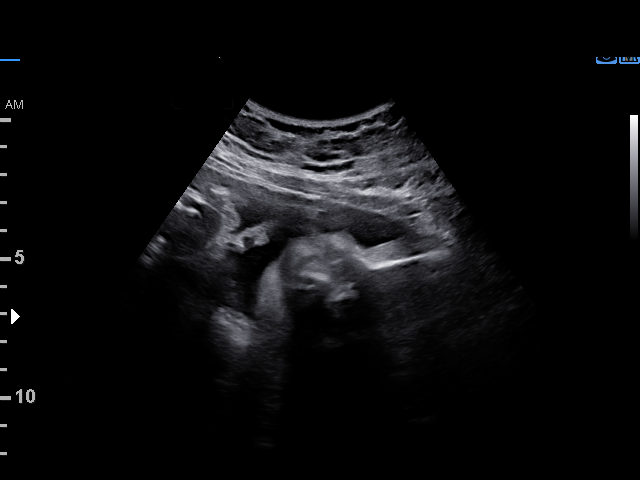
[im 27/32]
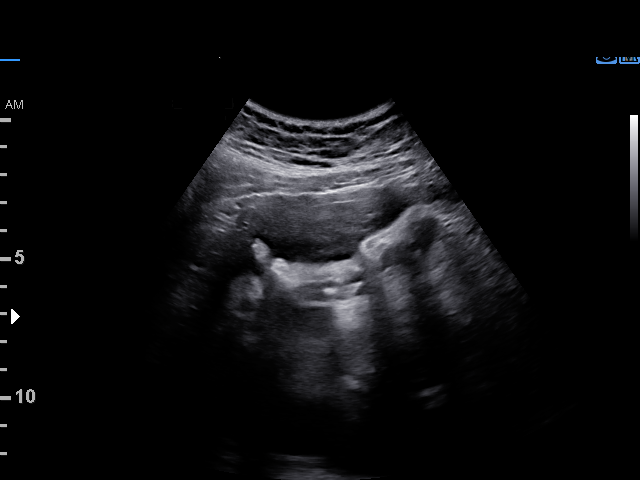
[im 29/32]
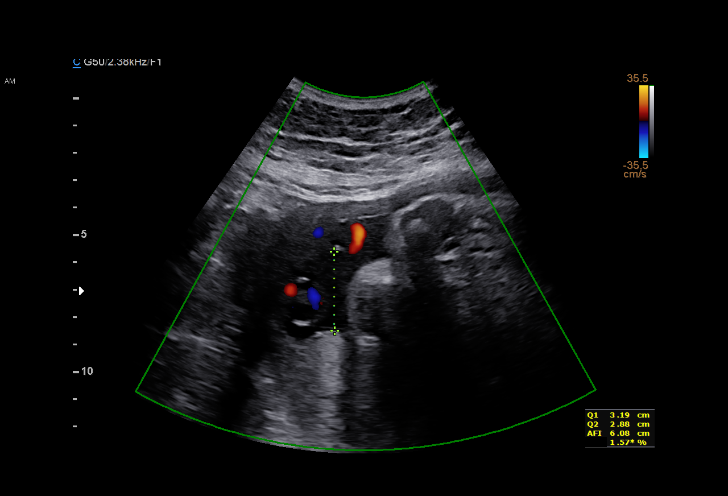
[im 32/32]
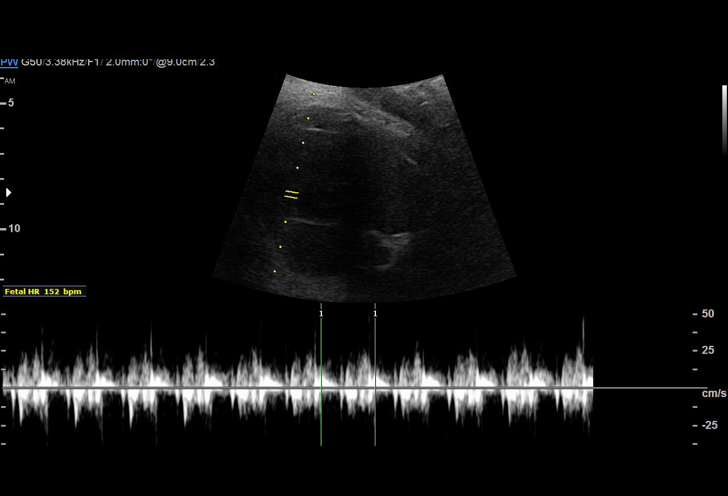

[15 of 28 positions shown; findings below may reference images not displayed]

OB/Gyn and
                                                            Infertility
                   OB/Gyn

Indications

 34 weeks gestation of pregnancy
 Obesity complicating pregnancy, third
 trimester
 Echogenic intracardiac focus of the heart
 (EIF)
Fetal Evaluation

 Num Of Fetuses:         1
 Fetal Heart Rate(bpm):  152
 Cardiac Activity:       Observed
 Presentation:           Cephalic
 Placenta:               Posterior
 P. Cord Insertion:      Previously Visualized

 Amniotic Fluid
 AFI FV:      Within normal limits

 AFI Sum(cm)     %Tile       Largest Pocket(cm)
 12.66           40

 RUQ(cm)       RLQ(cm)       LUQ(cm)        LLQ(cm)

Biophysical Evaluation

 Amniotic F.V:   Within normal limits       F. Tone:        Observed
 F. Movement:    Observed                   Score:          [DATE]
 F. Breathing:   Observed
OB History

 Gravidity:    2         Term:   0        Prem:   0        SAB:   1
 TOP:          0       Ectopic:  0        Living: 0
Gestational Age

 LMP:           34w 5d        Date:  01/30/21                 EDD:   11/06/21
 Best:          34w 5d     Det. By:  LMP  (01/30/21)          EDD:   11/06/21
Anatomy

 Heart:                 Appears normal; EIF    Abdomen:                Appears normal
 Diaphragm:             Appears normal         Kidneys:                Appear normal
 Stomach:               Appears normal, left   Bladder:                Appears normal
                        sided
Cervix Uterus Adnexa

 Cervix
 Not visualized (advanced GA >43wks)

 Uterus
 No abnormality visualized.

 Right Ovary
 Not visualized.

 Left Ovary
 Not visualized.
Impression

 Antenatal testing performed given elevated maternal BMI
 The biophysical profile was [DATE] with good fetal movement and
 amniotic fluid volume.
Recommendations

 Continue weekly testing as previously scheduled.

## 2023-09-01 IMAGING — US US MFM OB FOLLOW-UP
1 series · 13 of 28 positions shown · non-contrast
Comparison: none

[Series 1: us mfm ob follow-up · 47 acquisitions, 13 frames shown]
[im 2/47]
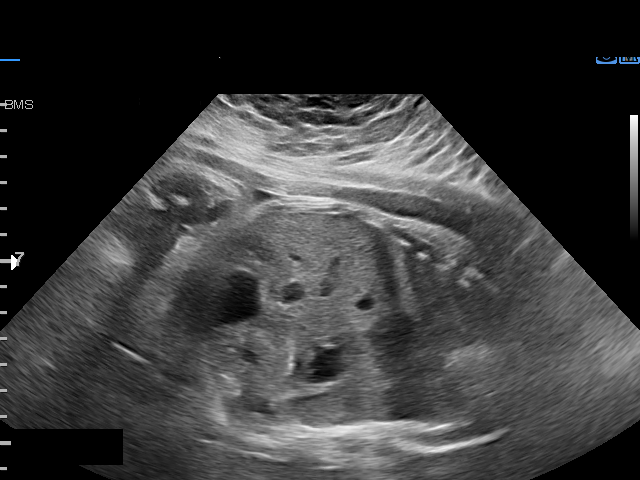
[im 6/47]
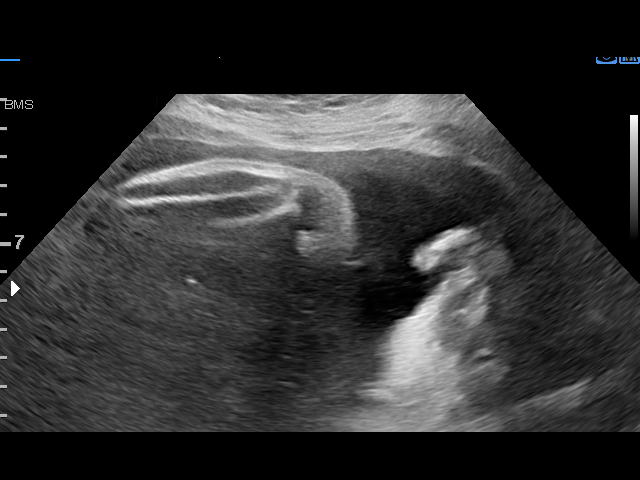
[im 9/47]
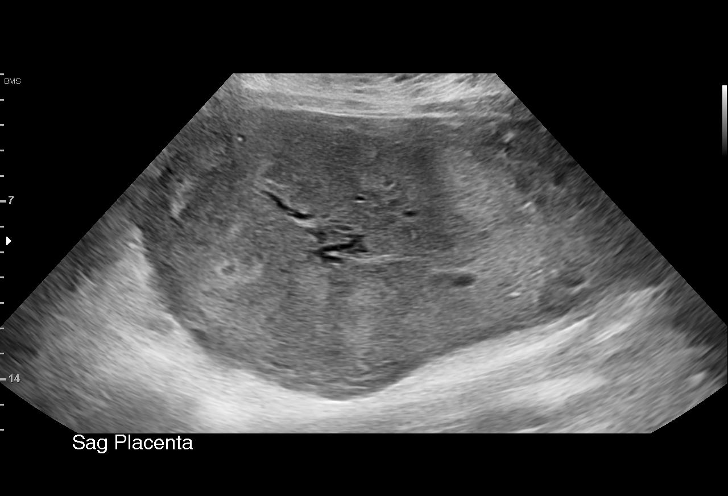
[im 12/47]
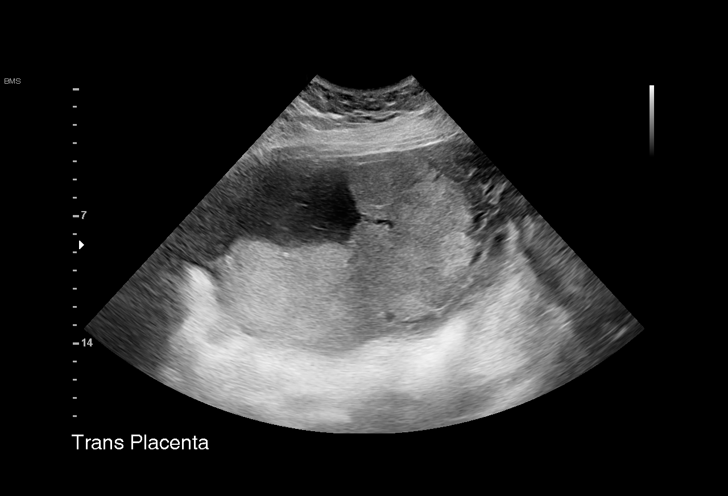
[im 16/47]
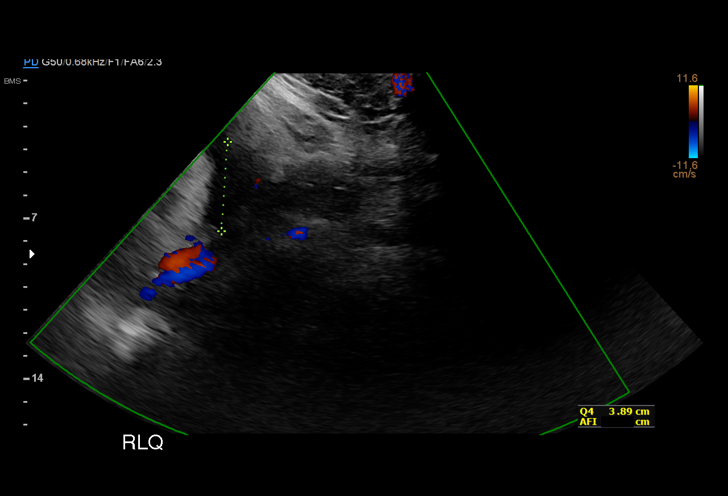
[im 19/47]
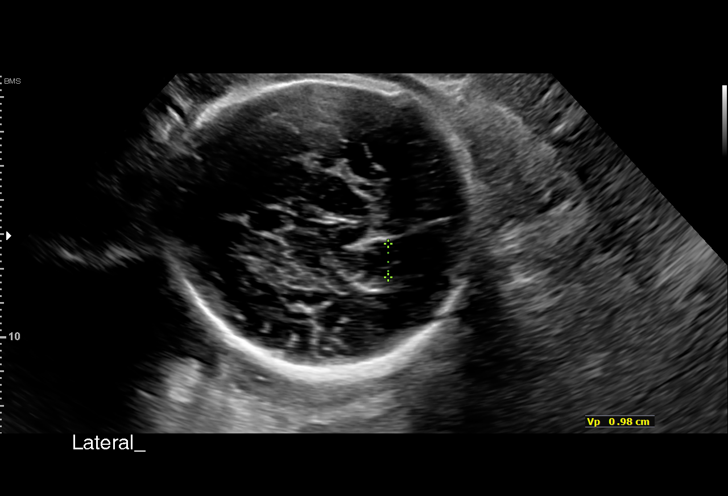
[im 24/47]
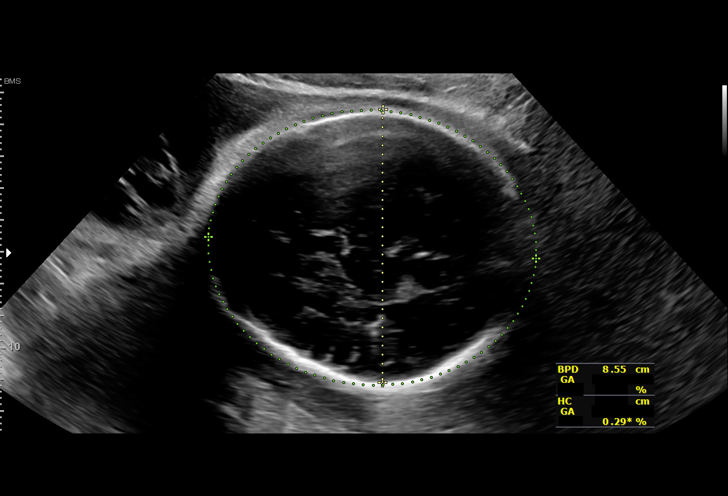
[im 28/47]
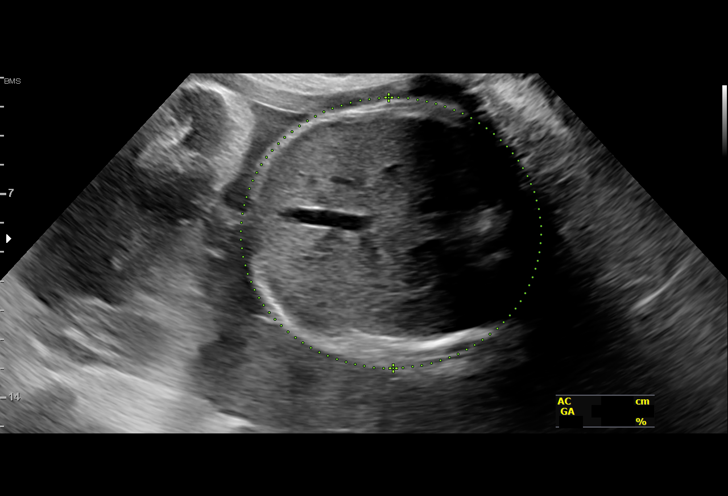
[im 31/47]
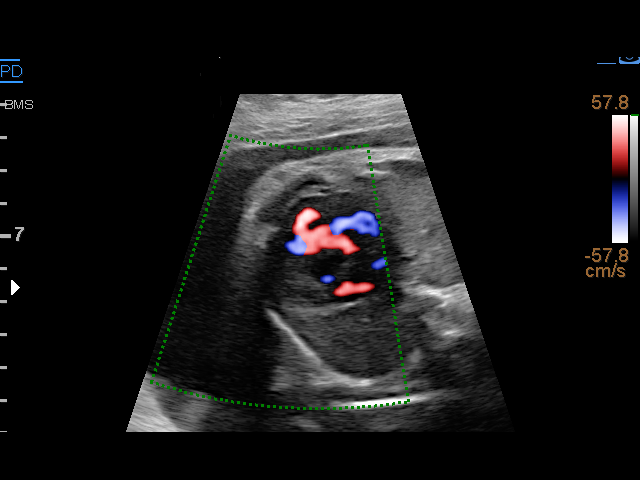
[im 35/47]
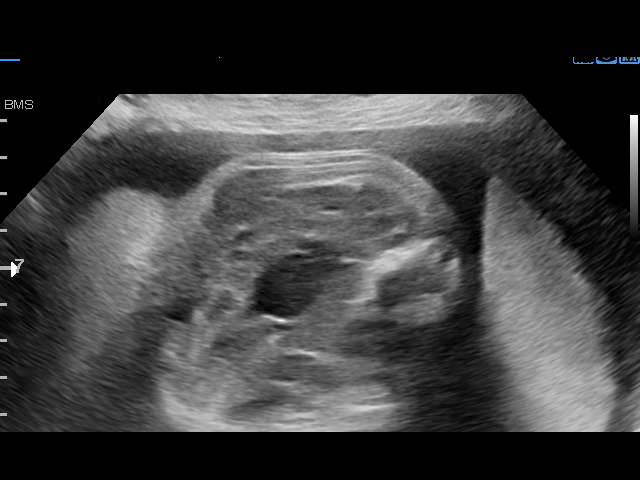
[im 38/47]
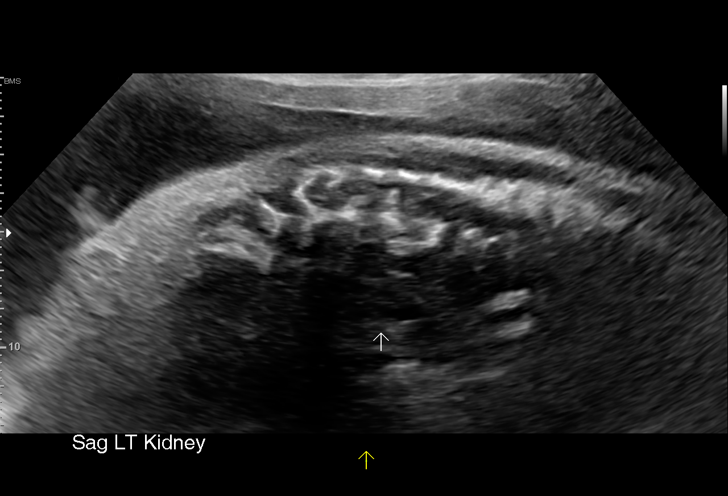
[im 41/47]
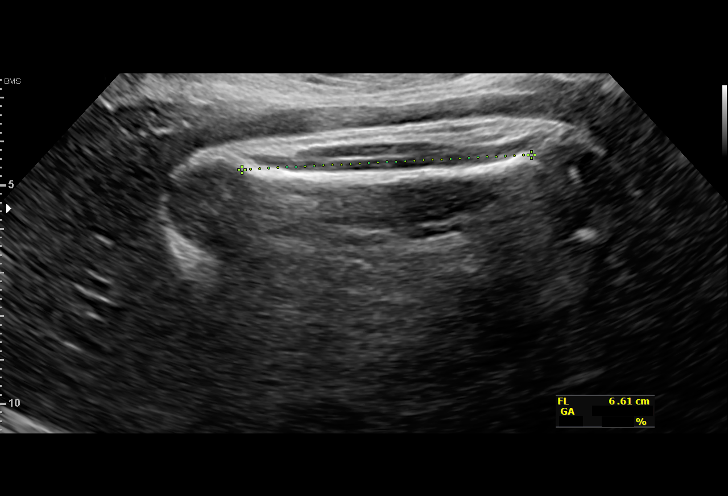
[im 45/47]
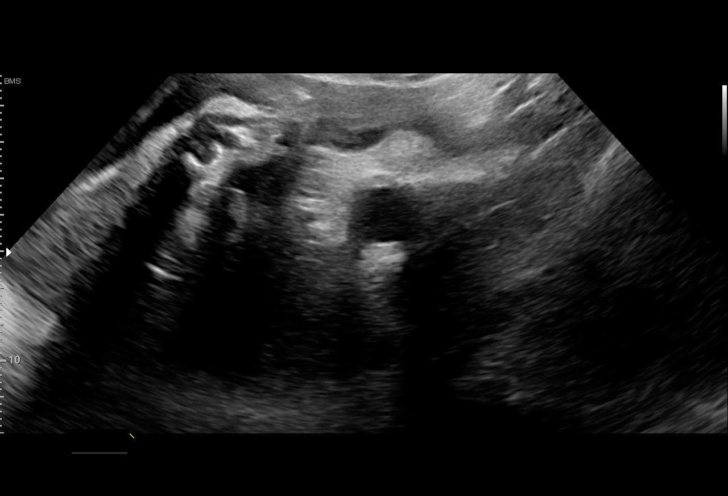

[13 of 28 positions shown; findings below may reference images not displayed]

OB/Gyn and
                                                            Infertility
                   OB/Gyn

Indications

 Obesity complicating pregnancy, third
 trimester
 Echogenic intracardiac focus of the heart
 (EIF)
 35 weeks gestation of pregnancy
Fetal Evaluation

 Num Of Fetuses:         1
 Fetal Heart Rate(bpm):  166
 Cardiac Activity:       Observed
 Presentation:           Cephalic
 Placenta:               Posterior
 P. Cord Insertion:      Previously Visualized

 Amniotic Fluid
 AFI FV:      Within normal limits

 AFI Sum(cm)     %Tile       Largest Pocket(cm)
 13.38           46
 RUQ(cm)       RLQ(cm)       LUQ(cm)        LLQ(cm)

Biophysical Evaluation

 Amniotic F.V:   Within normal limits       F. Tone:        Observed
 F. Movement:    Observed                   Score:          [DATE]
 F. Breathing:   Observed
Biometry

 BPD:      85.5  mm     G. Age:  34w 3d         24  %    CI:        81.42   %    70 - 86
                                                         FL/HC:      22.4   %    20.1 -
 HC:      299.1  mm     G. Age:  33w 1d        < 1  %    HC/AC:      0.98        0.93 -
 AC:      304.6  mm     G. Age:  34w 3d         26  %    FL/BPD:     78.4   %    71 - 87
 FL:         67  mm     G. Age:  34w 3d         18  %    FL/AC:      22.0   %    20 - 24
 HUM:      58.4  mm     G. Age:  33w 6d         33  %
 LV:        9.9  mm

 Est. FW:    7803  gm      5 lb 5 oz     17  %
OB History

 Gravidity:    2         Term:   0        Prem:   0        SAB:   1
 TOP:          0       Ectopic:  0        Living: 0
Gestational Age

 LMP:           35w 4d        Date:  01/30/21                 EDD:   11/06/21
 U/S Today:     34w 1d                                        EDD:   11/16/21
 Best:          35w 4d     Det. By:  LMP  (01/30/21)          EDD:   11/06/21
Anatomy

 Cranium:               Appears normal         LVOT:                   Previously seen
 Cavum:                 Appears normal         Aortic Arch:            Previously seen
 Ventricles:            Appears normal         Ductal Arch:            Previously seen
 Choroid Plexus:        Previously seen        Diaphragm:              Appears normal
 Cerebellum:            Previously seen        Stomach:                Appears normal, left
                                                                       sided
 Posterior Fossa:       Previously seen        Abdomen:                Appears normal
 Nuchal Fold:           Not applicable (>20    Abdominal Wall:         Previously seen
                        wks GA)
 Face:                  Orbits and profile     Cord Vessels:           Previously seen
                        previously seen
 Lips:                  Previously seen        Kidneys:                Appear normal
 Palate:                Not well visualized    Bladder:                Appears normal
 Thoracic:              Previously seen l      Spine:                  Previously seen
 Heart:                 Previously seen, EIF   Upper Extremities:      Previously seen
 RVOT:                  Previously seen        Lower Extremities:      Previously seen

 Other:  Heels/feet, open hands/5th digits, 3VV, 3VTV, nasal bone, lenses,
         maxilla and mandible all previously  visualized.Fetus previously seen
         to be female. Technically difficult due to fetal position.
Cervix Uterus Adnexa
 Cervix
 Not visualized (advanced GA >23wks)

 Uterus
 No abnormality visualized.

 Right Ovary
 Not visualized.

 Left Ovary
 Not visualized.

 Cul De Sac
 No free fluid seen.

 Adnexa
 No adnexal mass visualized.
Comments

 This patient was seen for a follow up growth scan and BPP
 due to maternal obesity with a BMI of 42.7.  She denies any
 problems since her last exam.
 She was informed that the fetal growth and amniotic fluid
 level appears appropriate for her gestational age.
 A BPP performed today was [DATE].
 Due to maternal obesity, we will continue to follow her with
 weekly fetal testing until delivery.
 Another BPP was scheduled in 1 week.

## 2023-09-13 ENCOUNTER — Other Ambulatory Visit (HOSPITAL_COMMUNITY): Payer: Self-pay

## 2023-09-13 MED ORDER — SEMAGLUTIDE-WEIGHT MANAGEMENT 0.5 MG/0.5ML ~~LOC~~ SOAJ
0.5000 mg | SUBCUTANEOUS | 0 refills | Status: AC
  Filled 2023-09-13: qty 2, 28d supply, fill #0

## 2023-10-07 ENCOUNTER — Other Ambulatory Visit (HOSPITAL_COMMUNITY): Payer: Self-pay

## 2023-10-10 ENCOUNTER — Other Ambulatory Visit (HOSPITAL_COMMUNITY): Payer: Self-pay

## 2023-10-10 MED ORDER — SEMAGLUTIDE-WEIGHT MANAGEMENT 1 MG/0.5ML ~~LOC~~ SOAJ
1.0000 mg | SUBCUTANEOUS | 0 refills | Status: DC
  Filled 2023-10-10: qty 2, 28d supply, fill #0

## 2023-10-11 ENCOUNTER — Other Ambulatory Visit (HOSPITAL_COMMUNITY): Payer: Self-pay

## 2023-10-11 MED ORDER — WEGOVY 1 MG/0.5ML ~~LOC~~ SOAJ
1.0000 mg | SUBCUTANEOUS | 0 refills | Status: DC
  Filled 2023-10-11: qty 2, 28d supply, fill #0

## 2023-10-12 ENCOUNTER — Other Ambulatory Visit (HOSPITAL_COMMUNITY): Payer: Self-pay

## 2023-10-12 MED ORDER — WEGOVY 2.4 MG/0.75ML ~~LOC~~ SOAJ
2.4000 mg | SUBCUTANEOUS | 2 refills | Status: AC
  Filled 2023-12-06: qty 3, 28d supply, fill #0
  Filled 2024-01-03: qty 3, 28d supply, fill #1
  Filled 2024-01-31: qty 3, 28d supply, fill #2

## 2023-10-12 MED ORDER — WEGOVY 1.7 MG/0.75ML ~~LOC~~ SOAJ
1.7000 mg | SUBCUTANEOUS | 0 refills | Status: DC
  Filled 2023-10-12 – 2023-11-08 (×2): qty 3, 28d supply, fill #0

## 2023-11-08 ENCOUNTER — Other Ambulatory Visit (HOSPITAL_COMMUNITY): Payer: Self-pay

## 2023-12-06 ENCOUNTER — Other Ambulatory Visit (HOSPITAL_COMMUNITY): Payer: Self-pay

## 2024-01-03 ENCOUNTER — Other Ambulatory Visit (HOSPITAL_COMMUNITY): Payer: Self-pay

## 2024-01-11 ENCOUNTER — Other Ambulatory Visit (HOSPITAL_COMMUNITY): Payer: Self-pay

## 2024-01-11 MED ORDER — ONDANSETRON 8 MG PO TBDP
8.0000 mg | ORAL_TABLET | Freq: Three times a day (TID) | ORAL | 0 refills | Status: AC | PRN
  Filled 2024-01-11: qty 60, 20d supply, fill #0

## 2024-01-11 MED ORDER — WEGOVY 2.4 MG/0.75ML ~~LOC~~ SOAJ
2.4000 mg | SUBCUTANEOUS | 2 refills | Status: AC
Start: 2024-01-11 — End: ?
  Filled 2024-01-11 – 2024-02-27 (×2): qty 3, 28d supply, fill #0

## 2024-01-17 ENCOUNTER — Other Ambulatory Visit (HOSPITAL_COMMUNITY): Payer: Self-pay

## 2024-01-31 ENCOUNTER — Other Ambulatory Visit (HOSPITAL_COMMUNITY): Payer: Self-pay

## 2024-02-22 ENCOUNTER — Other Ambulatory Visit (HOSPITAL_COMMUNITY): Payer: Self-pay

## 2024-02-22 MED ORDER — WEGOVY 2.4 MG/0.75ML ~~LOC~~ SOAJ
2.4000 mg | SUBCUTANEOUS | 0 refills | Status: AC
  Filled 2024-02-22: qty 3, 28d supply, fill #0

## 2024-02-27 ENCOUNTER — Other Ambulatory Visit (HOSPITAL_COMMUNITY): Payer: Self-pay

## 2024-02-28 ENCOUNTER — Other Ambulatory Visit (HOSPITAL_COMMUNITY): Payer: Self-pay
